# Patient Record
Sex: Female | Born: 1940 | Race: White | Hispanic: No | Marital: Married | Smoking: Former smoker
Health system: Southern US, Community
[De-identification: ages and names within clinical notes are randomized; demographics above are authoritative.]

## PROBLEM LIST (undated history)

## (undated) DIAGNOSIS — C801 Malignant (primary) neoplasm, unspecified: Secondary | ICD-10-CM

## (undated) DIAGNOSIS — Z9221 Personal history of antineoplastic chemotherapy: Secondary | ICD-10-CM

## (undated) DIAGNOSIS — Z923 Personal history of irradiation: Secondary | ICD-10-CM

## (undated) DIAGNOSIS — E079 Disorder of thyroid, unspecified: Secondary | ICD-10-CM

## (undated) DIAGNOSIS — C4490 Unspecified malignant neoplasm of skin, unspecified: Secondary | ICD-10-CM

## (undated) HISTORY — DX: Disorder of thyroid, unspecified: E07.9

## (undated) HISTORY — DX: Malignant (primary) neoplasm, unspecified: C80.1

## (undated) HISTORY — PX: TONSILECTOMY, ADENOIDECTOMY, BILATERAL MYRINGOTOMY AND TUBES: SHX2538

## (undated) HISTORY — PX: TUBAL LIGATION: SHX77

## (undated) HISTORY — PX: INNER EAR SURGERY: SHX679

---

## 2014-07-03 DIAGNOSIS — C801 Malignant (primary) neoplasm, unspecified: Secondary | ICD-10-CM

## 2014-07-03 HISTORY — DX: Malignant (primary) neoplasm, unspecified: C80.1

## 2016-07-16 ENCOUNTER — Encounter: Payer: Self-pay | Admitting: Family Medicine

## 2016-07-16 ENCOUNTER — Ambulatory Visit (INDEPENDENT_AMBULATORY_CARE_PROVIDER_SITE_OTHER): Payer: Medicare Other | Admitting: Family Medicine

## 2016-07-16 DIAGNOSIS — E559 Vitamin D deficiency, unspecified: Secondary | ICD-10-CM

## 2016-07-16 DIAGNOSIS — C3491 Malignant neoplasm of unspecified part of right bronchus or lung: Secondary | ICD-10-CM

## 2016-07-16 DIAGNOSIS — Z85118 Personal history of other malignant neoplasm of bronchus and lung: Secondary | ICD-10-CM | POA: Insufficient documentation

## 2016-07-16 DIAGNOSIS — R2681 Unsteadiness on feet: Secondary | ICD-10-CM

## 2016-07-16 DIAGNOSIS — E039 Hypothyroidism, unspecified: Secondary | ICD-10-CM | POA: Insufficient documentation

## 2016-07-16 DIAGNOSIS — M150 Primary generalized (osteo)arthritis: Secondary | ICD-10-CM | POA: Diagnosis not present

## 2016-07-16 DIAGNOSIS — M1990 Unspecified osteoarthritis, unspecified site: Secondary | ICD-10-CM | POA: Insufficient documentation

## 2016-07-16 DIAGNOSIS — M159 Polyosteoarthritis, unspecified: Secondary | ICD-10-CM

## 2016-07-16 MED ORDER — ACETAMINOPHEN 500 MG PO TABS: 500.0000 mg | ORAL_TABLET | Freq: Four times a day (QID) | ORAL | 0 refills | Status: DC | PRN

## 2016-07-16 NOTE — Patient Instructions (Signed)
Stop Nexium (esomeprazole) and iron. Use Tylenol as needed instead of ibuprofen (safer on stomach and kidneys)

## 2016-07-16 NOTE — Progress Notes (Signed)
Date:  07/16/2016   Name:  Kimberly Burch   DOB:  Jun 25, 1940   MRN:  [id number]  PCP:  Adline Potter, MD    Chief Complaint: Establish Care   History of Present Illness:  This is a 76 y.o. female seen for initial visit. Dx'd SCC R lung 2 yrs ago, received chemo and RT, developed pneumonia, still has port L chest, scheduled for flush two weeks at [facility]. Chest CT April stable. Hypothyroidism on Synthroid, vit D def on supplement. Anemia in past but resolved, still on iron. On Nexium x yrs, no current GERD sxs. Biotin for hair/nails, b complex for energy, ibuprofen qwk for OA. No hx established CVD. Father died 23 DM/heart dz, mother died 103 heart dz, sisters with OA, thyroid dz. Mammo in May ok, colonoscopy 7 yrs ago ok. Tetanus unknown, pneumo x 1 2 yrs ago, unsure which one, zoster 10 yrs ago. No recent falls.  Review of Systems:  Review of Systems  Constitutional: Negative for chills, fatigue and fever.  HENT: Negative for ear pain, sinus pain and trouble swallowing.   Eyes: Negative for pain.  Respiratory: Negative for cough and shortness of breath.   Cardiovascular: Negative for chest pain, palpitations and leg swelling.  Gastrointestinal: Negative for abdominal pain, constipation and diarrhea.  Genitourinary: Negative for difficulty urinating.  Musculoskeletal: Negative for joint swelling.  Neurological: Negative for syncope and light-headedness.    Patient Active Problem List   Diagnosis Date Noted  . Squamous cell lung cancer ([place]) 07/16/2016  . Hypothyroidism 07/16/2016  . Gait instability 07/16/2016  . Vitamin D deficiency 07/16/2016    Prior to Admission medications   Medication Sig Start Date End Date Taking? Authorizing Provider  b complex vitamins capsule Take 1 capsule by mouth daily.   Yes [provider]  Biotin 1 MG CAPS Take by mouth.   Yes [provider]  cholecalciferol (VITAMIN D) 1000 units tablet Take 2,000 Units by mouth  daily.   Yes [provider]  levothyroxine (SYNTHROID, LEVOTHROID) 50 MCG tablet Take 50 mcg by mouth daily before breakfast.   Yes [provider]  Multiple Vitamins-Minerals (MULTIVITAMIN WITH MINERALS) tablet Take 1 tablet by mouth daily.   Yes [provider]    No Known Allergies  Past Surgical History:  Procedure Laterality Date  . INNER EAR SURGERY     for hearing loss. L ear.  . TONSILECTOMY, ADENOIDECTOMY, BILATERAL MYRINGOTOMY AND TUBES    . TUBAL LIGATION      Social History  Substance Use Topics  . Smoking status: Former Research scientist (life sciences)  . Smokeless tobacco: Never Used  . Alcohol use No    Family History  Problem Relation Age of Onset  . Diabetes Father   . Hyperlipidemia Father   . Cancer Maternal Aunt   . Cancer Paternal Grandmother     Medication list has been reviewed and updated.  Physical Examination: BP (!) 102/58   Pulse 94   Ht 5\' 4"  (1.626 m)   Wt 110 lb 9.6 oz (50.2 kg)   SpO2 98%   BMI 18.98 kg/m   Physical Exam  Constitutional: She is oriented to person, place, and time. She appears well-developed and well-nourished.  HENT:  Head: Normocephalic and atraumatic.  Right Ear: External ear normal.  Left Ear: External ear normal.  Nose: Nose normal.  Mouth/Throat: Oropharynx is clear and moist.  TMs clear  Eyes: Conjunctivae and EOM are normal. Pupils are equal, round, and reactive  to light.  Neck: Neck supple. No thyromegaly present.  Cardiovascular: Normal rate, regular rhythm and normal heart sounds.   Pulmonary/Chest: Effort normal and breath sounds normal.  Abdominal: Soft. She exhibits no distension and no mass. There is no tenderness.  Musculoskeletal: She exhibits no edema or deformity.  Lymphadenopathy:    She has no cervical adenopathy.  Neurological: She is alert and oriented to person, place, and time. Coordination normal.  Romberg negative, gait sl unsteady  Skin: Skin is warm and dry.  Psychiatric: She  has a normal mood and affect. Her behavior is normal.  Nursing note and vitals reviewed.   Assessment and Plan:  1. Squamous cell carcinoma of right lung (HCC) Followed by oncology, for port flush in two weeks - Comprehensive Metabolic Panel (CMET) - CBC  2. Hypothyroidism, unspecified type On Synthroid - TSH  3. Gait instability Consider PT referral - B12  4. Vitamin D deficiency On supplement - Vitamin D (25 hydroxy)  5. Primary osteoarthritis involving multiple joints Change ibuprofen to Tylenol prn  6. Med review D/c Nexium and iron as no clear indication for use  7. HM Consider Tdap, Pneumovax/Prevnar, Shingrix next visit  Return in about 4 weeks (around 08/13/2016).   45 mins spent with pt over half in counseling  Satira Anis. [facility]  07/16/2016

## 2016-07-17 LAB — COMPREHENSIVE METABOLIC PANEL
ALK PHOS: 117 IU/L (ref 39–117)
ALT: 20 IU/L (ref 0–32)
AST: 29 IU/L (ref 0–40)
Albumin/Globulin Ratio: 1.7 (ref 1.2–2.2)
Albumin: 4.6 g/dL (ref 3.5–4.8)
BUN/Creatinine Ratio: 17 (ref 12–28)
BUN: 13 mg/dL (ref 8–27)
Bilirubin Total: 0.5 mg/dL (ref 0.0–1.2)
CHLORIDE: 98 mmol/L (ref 96–106)
CO2: 28 mmol/L (ref 20–29)
Calcium: 9.8 mg/dL (ref 8.7–10.3)
Creatinine, Ser: 0.75 mg/dL (ref 0.57–1.00)
GFR calc Af Amer: 90 mL/min/{1.73_m2} (ref >59)
GFR calc non Af Amer: 78 mL/min/{1.73_m2} (ref >59)
GLOBULIN, TOTAL: 2.7 g/dL (ref 1.5–4.5)
Glucose: 77 mg/dL (ref 65–99)
POTASSIUM: 5.1 mmol/L (ref 3.5–5.2)
SODIUM: 142 mmol/L (ref 134–144)
Total Protein: 7.3 g/dL (ref 6.0–8.5)

## 2016-07-17 LAB — CBC
Hematocrit: 42.7 % (ref 34.0–46.6)
Hemoglobin: 14.1 g/dL (ref 11.1–15.9)
MCH: 28.8 pg (ref 26.6–33.0)
MCHC: 33 g/dL (ref 31.5–35.7)
MCV: 87 fL (ref 79–97)
PLATELETS: 223 10*3/uL (ref 150–379)
RBC: 4.9 x10E6/uL (ref 3.77–5.28)
RDW: 15.1 % (ref 12.3–15.4)
WBC: 5.9 10*3/uL (ref 3.4–10.8)

## 2016-07-17 LAB — VITAMIN D 25 HYDROXY (VIT D DEFICIENCY, FRACTURES): Vit D, 25-Hydroxy: 54.4 ng/mL (ref 30.0–100.0)

## 2016-07-17 LAB — VITAMIN B12: VITAMIN B 12: 635 pg/mL (ref 232–1245)

## 2016-07-17 LAB — TSH: TSH: 4 u[IU]/mL (ref 0.450–4.500)

## 2016-07-29 NOTE — Progress Notes (Signed)
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 She is moving to the area and is transferring care to continue to monitor for follow-up. She currently feels well and is asymptomatic. She has no neurologic complaints. She denies any recent fevers or illnesses. She has a good appetite and denies weight loss. She has no chest pain, shortness of breath, cough, or hemoptysis. She denies any nausea, vomiting, constipation, or diarrhea. She has no urinary complaints. Patient feels at her baseline and offers no specific complaints today.  REVIEW OF SYSTEMS:   Review of Systems  Constitutional: Negative.  Negative for fever, malaise/fatigue and weight loss.  Respiratory: Negative.  Negative for cough and shortness of breath.   Cardiovascular: Negative.  Negative for chest pain and leg swelling.  Gastrointestinal: Negative.  Negative for abdominal pain.  Genitourinary: Negative.   Musculoskeletal: Negative.   Skin: Negative.  Negative for rash.  Neurological: Negative.  Negative for sensory change and weakness.  Psychiatric/Behavioral: Negative.  The patient is not nervous/anxious.     As per HPI. Otherwise, a complete review of systems is negative.  PAST MEDICAL HISTORY: Past Medical History:  Diagnosis Date  . Cancer ([place]) 07/2014   lung cancer- couldn't do surgery. Radiation and chemo.   . Thyroid disease     PAST SURGICAL HISTORY: Past Surgical History:  Procedure Laterality Date  . INNER EAR SURGERY     for hearing loss. L ear.  . TONSILECTOMY, ADENOIDECTOMY, BILATERAL MYRINGOTOMY AND TUBES    . TUBAL  LIGATION      FAMILY HISTORY: Family History  Problem Relation Age of Onset  . Diabetes Father   . Hyperlipidemia Father   . Cancer Maternal Aunt   . Cancer Paternal Grandmother     ADVANCED DIRECTIVES (Y/N):  N  HEALTH MAINTENANCE: Social History  Substance Use Topics  . Smoking status: Former Research scientist (life sciences)  . Smokeless tobacco: Never Used  . Alcohol use No     Colonoscopy:  PAP:  Bone density:  Lipid panel:  No Known Allergies  Current Outpatient Prescriptions  Medication Sig Dispense Refill  . acetaminophen (TYLENOL) 500 MG tablet Take 1 tablet (500 mg total) by mouth every 6 (six) hours as needed. 30 tablet 0  . b complex vitamins capsule Take 1 capsule by mouth daily.    . Biotin 1 MG CAPS Take by mouth.    . cholecalciferol (VITAMIN D) 1000 units tablet Take 2,000 Units by mouth daily.    Marland Kitchen levothyroxine (SYNTHROID, LEVOTHROID) 50 MCG tablet Take 50 mcg by mouth daily before breakfast.    . Multiple Vitamins-Minerals (MULTIVITAMIN WITH MINERALS) tablet Take 1 tablet by mouth daily.     No current facility-administered medications for this visit.    Facility-Administered Medications Ordered in Other Visits  Medication Dose Route Frequency Provider Last Rate Last Dose  . sodium chloride flush (NS) 0.9 % injection 10 mL  10 mL Intravenous PRN Lloyd Huger, MD   10 mL at 07/30/16 1155    OBJECTIVE: Vitals:   07/30/16 1111  BP: 106/70  Pulse: 69  Resp: 20  Temp: (!) 96.1 F (35.6 C)  Body mass index is 19.11 kg/m.    ECOG FS:0 - Asymptomatic  General: Well-developed, well-nourished, no acute distress. Eyes: Pink conjunctiva, anicteric sclera. HEENT: Normocephalic, moist mucous membranes, clear oropharnyx. Lungs: Clear to auscultation bilaterally. Heart: Regular rate and rhythm. No rubs, murmurs, or gallops. Abdomen: Soft, nontender, nondistended. No organomegaly noted, normoactive bowel sounds. Musculoskeletal: No edema, cyanosis, or clubbing. Neuro:  Alert, answering all questions appropriately. Cranial nerves grossly intact. Skin: No rashes or petechiae noted. Psych: Normal affect. Lymphatics: No cervical, calvicular, axillary or inguinal LAD.   LAB RESULTS:  Lab Results  Component Value Date   NA 142 07/16/2016   K 5.1 07/16/2016   CL 98 07/16/2016   CO2 28 07/16/2016   GLUCOSE 77 07/16/2016   BUN 13 07/16/2016   CREATININE 0.75 07/16/2016   CALCIUM 9.8 07/16/2016   PROT 7.3 07/16/2016   ALBUMIN 4.6 07/16/2016   AST 29 07/16/2016   ALT 20 07/16/2016   ALKPHOS 117 07/16/2016   BILITOT 0.5 07/16/2016   GFRNONAA 78 07/16/2016   GFRAA 90 07/16/2016    Lab Results  Component Value Date   WBC 5.9 07/16/2016   HGB 14.1 07/16/2016   HCT 42.7 07/16/2016   MCV 87 07/16/2016   PLT 223 07/16/2016     STUDIES: No results found.  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 She then received 2 cycles of consolidation treatment completing on November 15, 2014. She had a bronchoscopy performed on March 02, 2016 which did not reveal any signs of recurrent or progressive disease. 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 These findings were similar to prior CTs. No evidence of recurrent or progressive disease. No intervention is needed at this time. Patient will return to clinic in mid October for repeat CT scan and further evaluation. Plan on doing CT scans every 6 months until patient is 3 years removed from completing treatment in October 2019. At that point, patient can be switched to yearly imaging. 2. History of hemoptysis: Bronchoscopy and CT scan as above. We will consider a referral to pulmonology if necessary in the future. 3. Port:  Patient will require port flushes every 6 weeks.  Approximately 60 minutes was spent in discussion of which greater than 50% was consultation.  Patient expressed understanding and was in agreement with this plan. She also understands that She can call clinic at any time with any questions, concerns, or complaints.   Cancer Staging Squamous cell lung cancer [facility]) Staging form: Lung, AJCC 8th Edition - Clinical stage from 07/30/2016: Stage IIIB (cT4, cN2, cM0) - Signed by Lloyd Huger, MD on 07/30/2016   Lloyd Huger, MD   07/30/2016 1:06 PM

## 2016-07-30 ENCOUNTER — Inpatient Hospital Stay: Payer: Medicare Other | Attending: Oncology | Admitting: Oncology

## 2016-07-30 ENCOUNTER — Inpatient Hospital Stay: Payer: Medicare Other

## 2016-07-30 ENCOUNTER — Other Ambulatory Visit: Payer: Self-pay

## 2016-07-30 VITALS — BP 106/70 | HR 69 | Temp 96.1°F | Resp 20 | Wt 111.3 lb

## 2016-07-30 DIAGNOSIS — Z95828 Presence of other vascular implants and grafts: Secondary | ICD-10-CM

## 2016-07-30 DIAGNOSIS — Z809 Family history of malignant neoplasm, unspecified: Secondary | ICD-10-CM | POA: Insufficient documentation

## 2016-07-30 DIAGNOSIS — Z452 Encounter for adjustment and management of vascular access device: Secondary | ICD-10-CM | POA: Diagnosis not present

## 2016-07-30 DIAGNOSIS — Z85118 Personal history of other malignant neoplasm of bronchus and lung: Secondary | ICD-10-CM | POA: Insufficient documentation

## 2016-07-30 DIAGNOSIS — Z923 Personal history of irradiation: Secondary | ICD-10-CM | POA: Diagnosis not present

## 2016-07-30 DIAGNOSIS — Z79899 Other long term (current) drug therapy: Secondary | ICD-10-CM | POA: Diagnosis not present

## 2016-07-30 DIAGNOSIS — Z9221 Personal history of antineoplastic chemotherapy: Secondary | ICD-10-CM | POA: Diagnosis not present

## 2016-07-30 DIAGNOSIS — Z87891 Personal history of nicotine dependence: Secondary | ICD-10-CM

## 2016-07-30 DIAGNOSIS — E079 Disorder of thyroid, unspecified: Secondary | ICD-10-CM | POA: Diagnosis not present

## 2016-07-30 DIAGNOSIS — C3491 Malignant neoplasm of unspecified part of right bronchus or lung: Secondary | ICD-10-CM

## 2016-07-30 DIAGNOSIS — R918 Other nonspecific abnormal finding of lung field: Secondary | ICD-10-CM | POA: Insufficient documentation

## 2016-07-30 MED ORDER — SODIUM CHLORIDE 0.9% FLUSH
10.0000 mL | INTRAVENOUS | Status: DC | PRN
  Administered 2016-07-30: 10 mL via INTRAVENOUS
  Filled 2016-07-30: qty 10

## 2016-07-30 MED ORDER — HEPARIN SOD (PORK) LOCK FLUSH 100 UNIT/ML IV SOLN
500.0000 [IU] | Freq: Once | INTRAVENOUS | Status: AC
  Administered 2016-07-30: 500 [IU] via INTRAVENOUS

## 2016-07-30 NOTE — Progress Notes (Signed)
Patient here today to establish care regarding lung cancer.

## 2016-08-12 ENCOUNTER — Encounter: Payer: Self-pay | Admitting: Family Medicine

## 2016-08-16 ENCOUNTER — Encounter: Payer: Self-pay | Admitting: Family Medicine

## 2016-08-16 ENCOUNTER — Ambulatory Visit (INDEPENDENT_AMBULATORY_CARE_PROVIDER_SITE_OTHER): Payer: Medicare Other | Admitting: Family Medicine

## 2016-08-16 VITALS — BP 110/78 | HR 97 | Resp 16 | Ht 64.0 in | Wt 111.2 lb

## 2016-08-16 DIAGNOSIS — M159 Polyosteoarthritis, unspecified: Secondary | ICD-10-CM

## 2016-08-16 DIAGNOSIS — M150 Primary generalized (osteo)arthritis: Secondary | ICD-10-CM | POA: Diagnosis not present

## 2016-08-16 DIAGNOSIS — R2681 Unsteadiness on feet: Secondary | ICD-10-CM

## 2016-08-16 DIAGNOSIS — C3491 Malignant neoplasm of unspecified part of right bronchus or lung: Secondary | ICD-10-CM | POA: Diagnosis not present

## 2016-08-16 DIAGNOSIS — Z23 Encounter for immunization: Secondary | ICD-10-CM

## 2016-08-16 DIAGNOSIS — E559 Vitamin D deficiency, unspecified: Secondary | ICD-10-CM | POA: Diagnosis not present

## 2016-08-16 DIAGNOSIS — E039 Hypothyroidism, unspecified: Secondary | ICD-10-CM

## 2016-08-16 DIAGNOSIS — G4762 Sleep related leg cramps: Secondary | ICD-10-CM | POA: Insufficient documentation

## 2016-08-16 MED ORDER — ZOSTER VAC RECOMB ADJUVANTED 50 MCG/0.5ML IM SUSR: 0.5000 mL | Freq: Once | INTRAMUSCULAR | 1 refills | Status: AC

## 2016-08-16 NOTE — Patient Instructions (Signed)
Leg Cramps Leg cramps occur when a muscle or muscles tighten and you have no control over this tightening (involuntary muscle contraction). Muscle cramps can develop in any muscle, but the most common place is in the calf muscles of the leg. Those cramps can occur during exercise or when you are at rest. Leg cramps are painful, and they may last for a few seconds to a few minutes. Cramps may return several times before they finally stop. Usually, leg cramps are not caused by a serious medical problem. In many cases, the cause is not known. Some common causes include:  Overexertion.  Overuse from repetitive motions, or doing the same thing over and over.  Remaining in a certain position for a long period of time.  Improper preparation, form, or technique while performing a sport or an activity.  Dehydration.  Injury.  Side effects of some medicines.  Abnormally low levels of the salts and ions in your blood (electrolytes), especially potassium and calcium. These levels could be low if you are taking water pills (diuretics) or if you are pregnant.  Follow these instructions at home: Watch your condition for any changes. Taking the following actions may help to lessen any discomfort that you are feeling:  Stay well-hydrated. Drink enough fluid to keep your urine clear or pale yellow.  Try massaging, stretching, and relaxing the affected muscle. Do this for several minutes at a time.  For tight or tense muscles, use a warm towel, heating pad, or hot shower water directed to the affected area.  If you are sore or have pain after a cramp, applying ice to the affected area may relieve discomfort. ? Put ice in a plastic bag. ? Place a towel between your skin and the bag. ? Leave the ice on for 20 minutes, 2-3 times per day.  Avoid strenuous exercise for several days if you have been having frequent leg cramps.  Make sure that your diet includes the essential minerals for your muscles to  work normally.  Take medicines only as directed by your health care provider.  Contact a health care provider if:  Your leg cramps get more severe or more frequent, or they do not improve over time.  Your foot becomes cold, numb, or blue. This information is not intended to replace advice given to you by your health care provider. Make sure you discuss any questions you have with your health care provider. Document Released: 02/26/2004 Document Revised: 06/26/2015 Document Reviewed: 12/26/2013 Elsevier Interactive Patient Education  2018 Elsevier Inc.  

## 2016-08-17 ENCOUNTER — Ambulatory Visit: Payer: Medicare Other | Admitting: Family Medicine

## 2016-08-17 NOTE — Progress Notes (Signed)
Date:  08/16/2016   Name:  Kimberly Burch   DOB:  Mar 20, 1940   MRN:  [id number]  PCP:  Adline Potter, MD    Chief Complaint: Gastroesophageal Reflux (stopped nexium last OV) and leg cramp   History of Present Illness:  This is a 76 y.o. female seen in one month f/u from initial visit. Able to stop Nexium without sx recurrence. Blood work all ok. C/o R foot cramping at night, vinegar sometimes helps. OA sxs well controlled on Tylenol prn. Had Tdap 2011 and pneumo imms x 2, needs zoster imm.   Review of Systems:  Review of Systems  Constitutional: Negative for chills and fever.  Respiratory: Negative for cough and shortness of breath.   Cardiovascular: Negative for chest pain and leg swelling.  Neurological: Negative for syncope and light-headedness.    Patient Active Problem List   Diagnosis Date Noted  . Nocturnal leg cramps 08/16/2016  . Squamous cell lung cancer ([place]) 07/16/2016  . Hypothyroidism 07/16/2016  . Gait instability 07/16/2016  . Vitamin D deficiency 07/16/2016  . Osteoarthritis 07/16/2016    Prior to Admission medications   Medication Sig Start Date End Date Taking? Authorizing Provider  acetaminophen (TYLENOL) 500 MG tablet Take 1 tablet (500 mg total) by mouth every 6 (six) hours as needed. 07/16/16  Yes Eleri Ruben, Gwyndolyn Saxon, MD  b complex vitamins capsule Take 1 capsule by mouth daily.   Yes [provider]  Biotin 1 MG CAPS Take by mouth.   Yes [provider]  cholecalciferol (VITAMIN D) 1000 units tablet Take 2,000 Units by mouth daily.   Yes [provider]  levothyroxine (SYNTHROID, LEVOTHROID) 50 MCG tablet Take 50 mcg by mouth daily before breakfast.   Yes [provider]  Multiple Vitamins-Minerals (MULTIVITAMIN WITH MINERALS) tablet Take 1 tablet by mouth daily.   Yes [provider]    No Known Allergies  Past Surgical History:  Procedure Laterality Date  . INNER EAR SURGERY     for hearing loss. L ear.  .  TONSILECTOMY, ADENOIDECTOMY, BILATERAL MYRINGOTOMY AND TUBES    . TUBAL LIGATION      Social History  Substance Use Topics  . Smoking status: Former Research scientist (life sciences)  . Smokeless tobacco: Never Used  . Alcohol use No    Family History  Problem Relation Age of Onset  . Diabetes Father   . Hyperlipidemia Father   . Cancer Maternal Aunt   . Cancer Paternal Grandmother     Medication list has been reviewed and updated.  Physical Examination: BP 110/78   Pulse 97   Resp 16   Ht 5\' 4"  (1.626 m)   Wt 111 lb 3.2 oz (50.4 kg)   SpO2 98%   BMI 19.09 kg/m   Physical Exam  Constitutional: She appears well-developed and well-nourished.  Cardiovascular: Normal rate, regular rhythm, normal heart sounds and intact distal pulses.   Pulmonary/Chest: Effort normal and breath sounds normal.  Musculoskeletal: She exhibits no edema.  Neurological: She is alert.  Skin: Skin is warm and dry.  Psychiatric: She has a normal mood and affect. Her behavior is normal.  Nursing note and vitals reviewed.   Assessment and Plan:  1. Squamous cell carcinoma of right lung ([place]) Followed by oncology  2. Hypothyroidism, unspecified type Well controlled on Synthroid  3. Primary osteoarthritis involving multiple joints Well controlled on prn Tylenol  4. Vitamin D deficiency Well controlled on supplement  5. Gait instability Consider PT referral  6. Nocturnal leg  cramps Trial vinegar/pickle/mustard, handout given  7. Need for zoster vaccination - Zoster Vac Recomb Adjuvanted [facility]) injection; Inject 0.5 mLs into the muscle once.  Dispense: 0.5 mL; Refill: 1  Return in about 2 months (around 10/17/2016).  Satira Anis. [facility]  08/17/2016

## 2016-09-09 ENCOUNTER — Inpatient Hospital Stay: Payer: Medicare Other | Attending: Oncology

## 2016-09-09 DIAGNOSIS — Z452 Encounter for adjustment and management of vascular access device: Secondary | ICD-10-CM | POA: Insufficient documentation

## 2016-09-09 DIAGNOSIS — Z95828 Presence of other vascular implants and grafts: Secondary | ICD-10-CM

## 2016-09-09 DIAGNOSIS — Z923 Personal history of irradiation: Secondary | ICD-10-CM | POA: Diagnosis not present

## 2016-09-09 DIAGNOSIS — Z9221 Personal history of antineoplastic chemotherapy: Secondary | ICD-10-CM | POA: Diagnosis not present

## 2016-09-09 DIAGNOSIS — Z85118 Personal history of other malignant neoplasm of bronchus and lung: Secondary | ICD-10-CM | POA: Diagnosis present

## 2016-09-09 MED ORDER — HEPARIN SOD (PORK) LOCK FLUSH 100 UNIT/ML IV SOLN
INTRAVENOUS | Status: AC
  Filled 2016-09-09: qty 5

## 2016-09-09 MED ORDER — HEPARIN SOD (PORK) LOCK FLUSH 100 UNIT/ML IV SOLN
500.0000 [IU] | Freq: Once | INTRAVENOUS | Status: AC
  Administered 2016-09-09: 500 [IU] via INTRAVENOUS

## 2016-09-09 MED ORDER — SODIUM CHLORIDE 0.9% FLUSH
10.0000 mL | INTRAVENOUS | Status: DC | PRN
  Administered 2016-09-09: 10 mL via INTRAVENOUS
  Filled 2016-09-09: qty 10

## 2016-09-10 ENCOUNTER — Inpatient Hospital Stay: Payer: Medicare Other

## 2016-09-13 ENCOUNTER — Encounter: Payer: Self-pay | Admitting: Internal Medicine

## 2016-09-14 ENCOUNTER — Ambulatory Visit (INDEPENDENT_AMBULATORY_CARE_PROVIDER_SITE_OTHER): Payer: Medicare Other | Admitting: Family Medicine

## 2016-09-14 ENCOUNTER — Encounter: Payer: Self-pay | Admitting: Family Medicine

## 2016-09-14 VITALS — BP 138/76 | HR 92 | Ht 64.0 in | Wt 115.0 lb

## 2016-09-14 DIAGNOSIS — E559 Vitamin D deficiency, unspecified: Secondary | ICD-10-CM

## 2016-09-14 DIAGNOSIS — C3491 Malignant neoplasm of unspecified part of right bronchus or lung: Secondary | ICD-10-CM

## 2016-09-14 DIAGNOSIS — R2681 Unsteadiness on feet: Secondary | ICD-10-CM | POA: Diagnosis not present

## 2016-09-14 DIAGNOSIS — M150 Primary generalized (osteo)arthritis: Secondary | ICD-10-CM

## 2016-09-14 DIAGNOSIS — E039 Hypothyroidism, unspecified: Secondary | ICD-10-CM | POA: Diagnosis not present

## 2016-09-14 DIAGNOSIS — M159 Polyosteoarthritis, unspecified: Secondary | ICD-10-CM

## 2016-09-14 DIAGNOSIS — S46811A Strain of other muscles, fascia and tendons at shoulder and upper arm level, right arm, initial encounter: Secondary | ICD-10-CM | POA: Diagnosis not present

## 2016-09-14 MED ORDER — NAPROXEN 500 MG PO TABS: 500.0000 mg | ORAL_TABLET | Freq: Two times a day (BID) | ORAL | 0 refills | Status: DC

## 2016-09-15 ENCOUNTER — Ambulatory Visit: Payer: Medicare Other | Admitting: Family Medicine

## 2016-09-15 NOTE — Progress Notes (Signed)
Date:  09/14/2016   Name:  Kimberly Burch   DOB:  May 02, 1940   MRN:  [id number]  PCP:  Adline Potter, MD    Chief Complaint: Shoulder Pain (5-6 years ago had trouble with Right Shoulder. Was given steriod shot back then. Helped but now pain is back. She does arm exercises to try and help pain. Pain is radiating down into back and right side of neck. Holding arm up over head hurts. Tried taking 2 Ibuprofen tabs OTC but not getting any better. Patient wanting to get XRay.  )   History of Present Illness:  This is a 76 y.o. female seen for one month f/u. Stage IIIB SCCL seeing oncology. C/o 5-6 yr hx R shoulder pain, told overuse in past, improved with steroid inj, worse now x 1 wk, started after bowling, radiates to interscapular area, getting worse. OA pain ok on prn Tylenol, taking vit D supp, gait instability noted on exam last visit.  Review of Systems:  Review of Systems  Constitutional: Negative for chills and fever.  Respiratory: Negative for cough and shortness of breath.   Cardiovascular: Negative for chest pain and leg swelling.  Gastrointestinal: Negative for abdominal pain.  Genitourinary: Negative for difficulty urinating.  Neurological: Negative for syncope and light-headedness.    Patient Active Problem List   Diagnosis Date Noted  . Nocturnal leg cramps 08/16/2016  . Squamous cell lung cancer ([place]) 07/16/2016  . Hypothyroidism 07/16/2016  . Gait instability 07/16/2016  . Vitamin D deficiency 07/16/2016  . Osteoarthritis 07/16/2016    Prior to Admission medications   Medication Sig Start Date End Date Taking? Authorizing Provider  acetaminophen (TYLENOL) 500 MG tablet Take 1 tablet (500 mg total) by mouth every 6 (six) hours as needed. 07/16/16  Yes Trenell Moxey, Gwyndolyn Saxon, MD  levothyroxine (SYNTHROID, LEVOTHROID) 50 MCG tablet Take 50 mcg by mouth daily before breakfast.   Yes [provider]  Multiple Vitamins-Minerals (MULTIVITAMIN WITH MINERALS) tablet Take 1  tablet by mouth daily.   Yes [provider]  naproxen (NAPROSYN) 500 MG tablet Take 1 tablet (500 mg total) by mouth 2 (two) times daily with a meal. 09/14/16   Adline Potter, MD    No Known Allergies  Past Surgical History:  Procedure Laterality Date  . INNER EAR SURGERY     for hearing loss. L ear.  . TONSILECTOMY, ADENOIDECTOMY, BILATERAL MYRINGOTOMY AND TUBES    . TUBAL LIGATION      Social History  Substance Use Topics  . Smoking status: Former Research scientist (life sciences)  . Smokeless tobacco: Never Used  . Alcohol use No    Family History  Problem Relation Age of Onset  . Diabetes Father   . Hyperlipidemia Father   . Cancer Maternal Aunt   . Cancer Paternal Grandmother     Medication list has been reviewed and updated.  Physical Examination: BP 138/76   Pulse 92   Ht 5\' 4"  (1.626 m)   Wt 115 lb (52.2 kg)   SpO2 99%   BMI 19.74 kg/m   Physical Exam  Constitutional: She appears well-developed and well-nourished.  Cardiovascular: Normal rate, regular rhythm and normal heart sounds.   Pulmonary/Chest: Effort normal and breath sounds normal.  Musculoskeletal: She exhibits no edema.  Tender over R trapezius  Neurological: She is alert.  Skin: Skin is warm and dry.  Psychiatric: She has a normal mood and affect. Her behavior is normal.  Nursing note and vitals reviewed.   Assessment and Plan:  1.  Trapezius strain, right, initial encounter Naprosyn x 2 wks only, doubt related to [facility] - Ambulatory referral to Physical Therapy  2. Squamous cell carcinoma of right lung ([place]) Followed by oncology  3. Gait instability - Ambulatory referral to Physical Therapy  4. Hypothyroidism, unspecified type Well controlled on Synthroid  5. Primary osteoarthritis involving multiple joints Well controlled on prn Tylenol  6. Vitamin D deficiency Well controlled on supplement  Return if symptoms worsen or fail to improve.  Satira Anis. [facility]  09/15/2016

## 2016-09-20 ENCOUNTER — Ambulatory Visit: Payer: Medicare Other | Attending: Family Medicine | Admitting: Physical Therapy

## 2016-09-20 ENCOUNTER — Encounter: Payer: Self-pay | Admitting: Physical Therapy

## 2016-09-20 DIAGNOSIS — R293 Abnormal posture: Secondary | ICD-10-CM | POA: Diagnosis present

## 2016-09-20 DIAGNOSIS — M542 Cervicalgia: Secondary | ICD-10-CM | POA: Insufficient documentation

## 2016-09-20 DIAGNOSIS — G8929 Other chronic pain: Secondary | ICD-10-CM | POA: Insufficient documentation

## 2016-09-20 DIAGNOSIS — M25511 Pain in right shoulder: Secondary | ICD-10-CM | POA: Insufficient documentation

## 2016-09-20 DIAGNOSIS — M436 Torticollis: Secondary | ICD-10-CM

## 2016-09-20 NOTE — Therapy (Signed)
[facility] [address] [place], [place], [address] Phone: [phone]   Fax:  [phone]  Physical Therapy Evaluation  Patient Details  Name: Kimberly Burch MRN: [id number] Date of Birth: 08-Jan-1941 Referring Provider: Dr. Vicente Masson  Encounter Date: 09/20/2016      PT End of Session - 09/20/16 1254    Visit Number 1   Number of Visits 8   Date for PT Re-Evaluation 10/18/16   Authorization - Visit Number 1   Authorization - Number of Visits 10   PT Start Time 6962   PT Stop Time 1345   PT Time Calculation (min) 58 min   Activity Tolerance Patient tolerated treatment well   Behavior During Therapy [facility] for tasks assessed/performed      Past Medical History:  Diagnosis Date  . Cancer ([place]) 07/2014   lung cancer- couldn't do surgery. Radiation and chemo.   . Thyroid disease     Past Surgical History:  Procedure Laterality Date  . INNER EAR SURGERY     for hearing loss. L ear.  . TONSILECTOMY, ADENOIDECTOMY, BILATERAL MYRINGOTOMY AND TUBES    . TUBAL LIGATION      There were no vitals filed for this visit.       Subjective Assessment - 09/20/16 1249    Subjective Pt. reports 5/10 R shoulder/ UT pain currently. Pt. states R UT pain has been on/off over past 5 years.   Pt. reports difficulty sleeping on R side.  Pt. has received an injection in past with benefit.     Pertinent History Pt. reports having R lung cancer 2 years ago and doing much better.     Limitations Lifting;House hold activities   Patient Stated Goals Increase R shoulder AROM/ pain-free mobility with daily tasks/ managing hair.     Currently in Pain? Yes   Pain Score 5    Pain Location Shoulder   Pain Orientation Right   Pain Descriptors / Indicators Discomfort;Aching   Pain Type Chronic pain      Shoulder ROM: WFL  Shoulder MMT: 4+/5 gross bilaterall  Cervical ROM: WFL with exception of lateral flexion: R 37 deg; L 24 deg  Palpation:  tenderness to palpation of R UT, levator scap, cervical paraspinals. Noted trigger point in R UT and levator scap  Manual: Notable hypomobility in C4-T5 vertebrae; pt reports tenderness to palpation    Objective measurements completed on examination: See above findings.   TherEx: Self UT stretch 3x30s Self levator scap stretch 3x30s Thoracic extensions over chair x15 Scapular retractions with RTB x15   Pt required continued verbal and tactile cueing to perform stretches and exercises correctly  Pt reports decrease in pain from 5/10 at beginning of session to 0/10 at the end of tx session        PT Education - 09/20/16 1400    Education provided Yes   Education Details see handout   Person(s) Educated Patient   Methods Explanation;Handout;Demonstration;Tactile cues;Verbal cues   Comprehension Verbalized understanding;Returned demonstration             PT Long Term Goals - 09/20/16 1713      PT LONG TERM GOAL #1   Title Pt will decrease QuickDASH score to <50% in order to decrease self-reported disability   Baseline 09/20/16: 68&   Time 4   Period Weeks   Status New     PT LONG TERM GOAL #2   Title Pt will report ability to work  in her garden without increase in pain   Baseline unable/afraid to work in garden as of 09-28-22   Time 4   Period Weeks   Status New     PT LONG TERM GOAL #3   Title Pt will increase cervical lateral flexion to >45 deg bilaterally    Baseline R: 37 deg; L: 24 deg on 09/27/16   Time 4   Period Weeks   Status New     PT LONG TERM GOAL #4   Title Pt will report decreased tenderness upon palpation of R UT, levator scapular, and cervical paraspinals   Baseline (+) for tenderness   Time 4   Period Weeks   Status New           Plan - 2016/09/27 1255    Clinical Impression Statement Pt age 76 reports to PT with chronic R upper back beginning 5-6 yrs ago with recent worsening of symptoms. Pt demonstrates full cervical ROM with exception  of decreased lateral flexion bilaterally (R 36deg, L 24deg). Pt also demonstrates B shoulder MMT WFL, but reports she has "lost all her muscle" after bout with lung cancer. Pt demonstrates marked self-reported shoulder disability made evident by QuickDASH score of 68% (severe disability). Pt demonstrates tenderness to palpation of UT and levator scapulae with palpable trigger point, as well as cervical paraspinals with no palpable trigger point.  Pt very active and motivated to return back to activities (bowling, golf, etc). Pt will benefit from skilled PT in order to increase pain-free ROM in order to allow pt to participate in desired activities.    Clinical Presentation Stable   Clinical Decision Making Moderate   Rehab Potential Good   PT Frequency 2x / week   PT Duration 4 weeks   PT Treatment/Interventions ADLs/Self Care Home Management;Cryotherapy;Moist Heat;Neuromuscular re-education;Therapeutic exercise;Therapeutic activities;Functional mobility training;Patient/family education;Manual techniques;Passive range of motion;Dry needling   PT Next Visit Plan Review HEP      Patient will benefit from skilled therapeutic intervention in order to improve the following deficits and impairments:  Hypomobility, Decreased activity tolerance, Decreased strength, Pain, Impaired UE functional use, Decreased mobility, Improper body mechanics, Decreased range of motion, Impaired flexibility, Postural dysfunction  Visit Diagnosis: Acute muscle stiffness of neck  Abnormal posture  Chronic right shoulder pain  Neck pain on right side      G-Codes - 2016-09-27 1633    Functional Assessment Tool Used (Outpatient Only) Clinical impression/ pain/ joint stiffness/ muscle weakness/ QuickDASH   Functional Limitation Carrying, moving and handling objects   Carrying, Moving and Handling Objects Current Status (G9211) At least 20 percent but less than 40 percent impaired, limited or restricted   Carrying,  Moving and Handling Objects Goal Status (H4174) At least 1 percent but less than 20 percent impaired, limited or restricted       Problem List Patient Active Problem List   Diagnosis Date Noted  . Nocturnal leg cramps 08/16/2016  . Squamous cell lung cancer ([place]) 07/16/2016  . Hypothyroidism 07/16/2016  . Gait instability 07/16/2016  . Vitamin D deficiency 07/16/2016  . Osteoarthritis 07/16/2016    Pura Spice, PT, DPT # [id number] Betsy Coder, SPT 09/21/2016, 1:25 PM  [place] [facility] [address] [place], [place], [address] Phone: [phone]   Fax:  [phone]  Name: Kimberly Burch MRN: [id number] Date of Birth: [birth year]

## 2016-09-27 ENCOUNTER — Ambulatory Visit: Payer: Medicare Other | Admitting: Physical Therapy

## 2016-09-27 ENCOUNTER — Encounter: Payer: Self-pay | Admitting: Physical Therapy

## 2016-09-27 DIAGNOSIS — R293 Abnormal posture: Secondary | ICD-10-CM

## 2016-09-27 DIAGNOSIS — M542 Cervicalgia: Secondary | ICD-10-CM

## 2016-09-27 DIAGNOSIS — M436 Torticollis: Secondary | ICD-10-CM

## 2016-09-27 DIAGNOSIS — M25511 Pain in right shoulder: Secondary | ICD-10-CM

## 2016-09-27 DIAGNOSIS — G8929 Other chronic pain: Secondary | ICD-10-CM

## 2016-09-27 NOTE — Therapy (Signed)
[place] [facility] [address] [place], [place], [address] Phone: [phone]   Fax:  [phone]  Physical Therapy Treatment  Patient Details  Name: Kimberly Burch MRN: [id number] Date of Birth: Jun 10, 1940 Referring Provider: Dr. Vicente Masson  Encounter Date: 09/27/2016      PT End of Session - 09/27/16 1249    Visit Number 2   Number of Visits 8   Date for PT Re-Evaluation 10/18/16   Authorization - Visit Number 2   Authorization - Number of Visits 10   PT Start Time 1304   PT Stop Time 1356   PT Time Calculation (min) 52 min   Activity Tolerance Patient tolerated treatment well   Behavior During Therapy [facility] for tasks assessed/performed      Past Medical History:  Diagnosis Date  . Cancer ([place]) 07/2014   lung cancer- couldn't do surgery. Radiation and chemo.   . Thyroid disease     Past Surgical History:  Procedure Laterality Date  . INNER EAR SURGERY     for hearing loss. L ear.  . TONSILECTOMY, ADENOIDECTOMY, BILATERAL MYRINGOTOMY AND TUBES    . TUBAL LIGATION      There were no vitals filed for this visit.      Subjective Assessment - 09/27/16 1249    Subjective Pt. reports she is doing better.  No issues with HEP.  Pt. reports 2/10 R UT pain.    Pertinent History Pt. reports having R lung cancer 2 years ago and doing much better.     Limitations Lifting;House hold activities   Patient Stated Goals Increase R shoulder AROM/ pain-free mobility with daily tasks/ managing hair.     Currently in Pain? Yes   Pain Score 2    Pain Location Neck   Pain Orientation Right   Pain Descriptors / Indicators Aching      Therex.:  Reviewed HEP.  Standing RTB scap. Retraction/ tricep ext./ horizontal abduction/ shoulder flexion 10x2.   Supine shoulder flexion with wand 20x.  See new RTB ex.   Manual tx.:  Supine cervical UT/levator stretches 3x each direction (static holds).  STM to B UT musculature/ cervical  paraspinals.  Seated  posture correction with STM to R mid-low cervical paraspinals/ UT region.    Instructed pt. To ice R UT/neck if any soreness/ pain this evening.            PT Long Term Goals - 09/20/16 1713      PT LONG TERM GOAL #1   Title Pt will decrease QuickDASH score to <50% in order to decrease self-reported disability   Baseline 09/20/16: 68&   Time 4   Period Weeks   Status New     PT LONG TERM GOAL #2   Title Pt will report ability to work in her garden without increase in pain   Baseline unable/afraid to work in garden as of 8/20   Time 4   Period Weeks   Status New     PT LONG TERM GOAL #3   Title Pt will increase cervical lateral flexion to >45 deg bilaterally    Baseline R: 37 deg; L: 24 deg on 09/20/16   Time 4   Period Weeks   Status New     PT LONG TERM GOAL #4   Title Pt will report decreased tenderness upon palpation of R UT, levator scapular, and cervical paraspinals   Baseline (+) for tenderness   Time 4   Period  Weeks   Status New               Plan - 09/27/16 1249    Clinical Impression Statement R UT tenderness noted but able to tolerate manual tx./ stretches well.  No increase c/o pain with manual tx./ progression of ther.ex. program.  PT issued more RTB UE ex. with minimal cuing for proper technique/ posture correction.  Pt. instructed to ice R UT/neck if any soreness/ pain this evening.     Clinical Presentation Stable   Clinical Decision Making Moderate   Rehab Potential Good   PT Frequency 2x / week   PT Duration 4 weeks   PT Treatment/Interventions ADLs/Self Care Home Management;Cryotherapy;Moist Heat;Neuromuscular re-education;Therapeutic exercise;Therapeutic activities;Functional mobility training;Patient/family education;Manual techniques;Passive range of motion;Dry needling   PT Next Visit Plan Review HEP      Patient will benefit from skilled therapeutic intervention in order to improve the following deficits and impairments:   Hypomobility, Decreased activity tolerance, Decreased strength, Pain, Impaired UE functional use, Decreased mobility, Improper body mechanics, Decreased range of motion, Impaired flexibility, Postural dysfunction  Visit Diagnosis: Acute muscle stiffness of neck  Abnormal posture  Chronic right shoulder pain  Neck pain on right side     Problem List Patient Active Problem List   Diagnosis Date Noted  . Nocturnal leg cramps 08/16/2016  . Squamous cell lung cancer ([place]) 07/16/2016  . Hypothyroidism 07/16/2016  . Gait instability 07/16/2016  . Vitamin D deficiency 07/16/2016  . Osteoarthritis 07/16/2016   Pura Spice, PT, DPT # [id number] 09/28/2016, 5:20 PM  [place] [facility] [address] [place], [place], [address] Phone: [phone]   Fax:  [phone]  Name: Kimberly Burch MRN: [id number] Date of Birth: November 29, 1940

## 2016-10-05 ENCOUNTER — Ambulatory Visit: Payer: Medicare Other | Attending: Family Medicine | Admitting: Physical Therapy

## 2016-10-05 ENCOUNTER — Encounter: Payer: Self-pay | Admitting: Physical Therapy

## 2016-10-05 DIAGNOSIS — G8929 Other chronic pain: Secondary | ICD-10-CM | POA: Diagnosis present

## 2016-10-05 DIAGNOSIS — M542 Cervicalgia: Secondary | ICD-10-CM

## 2016-10-05 DIAGNOSIS — R293 Abnormal posture: Secondary | ICD-10-CM

## 2016-10-05 DIAGNOSIS — M25511 Pain in right shoulder: Secondary | ICD-10-CM | POA: Diagnosis present

## 2016-10-05 DIAGNOSIS — M436 Torticollis: Secondary | ICD-10-CM

## 2016-10-05 NOTE — Therapy (Signed)
[place] [facility] [address] [place], [place], [address] Phone: [phone]   Fax:  [phone]  Physical Therapy Treatment  Patient Details  Name: Kimberly Burch MRN: [id number] Date of Birth: [birth year] Referring Provider: Dr. Vicente Masson  Encounter Date: 10/05/2016      PT End of Session - 10/05/16 1308    Visit Number 3   Number of Visits 8   Date for PT Re-Evaluation 10/18/16   Authorization - Visit Number 3   Authorization - Number of Visits 10   PT Start Time 7782   PT Stop Time 1340   PT Time Calculation (min) 42 min   Activity Tolerance Patient tolerated treatment well   Behavior During Therapy [facility] for tasks assessed/performed      Past Medical History:  Diagnosis Date  . Cancer ([place]) 07/2014   lung cancer- couldn't do surgery. Radiation and chemo.   . Thyroid disease     Past Surgical History:  Procedure Laterality Date  . INNER EAR SURGERY     for hearing loss. L ear.  . TONSILECTOMY, ADENOIDECTOMY, BILATERAL MYRINGOTOMY AND TUBES    . TUBAL LIGATION      There were no vitals filed for this visit.      Subjective Assessment - 10/05/16 1300    Subjective Pt reports she has been waking up with a mild headache which she attributes to her UT pain which has been occurring since initial injury.  She is no longer having pain referring to R shoulder.     Pertinent History Pt. reports having R lung cancer 2 years ago and doing much better.     Limitations Lifting;House hold activities   Patient Stated Goals Increase R shoulder AROM/ pain-free mobility with daily tasks/ managing hair.     Currently in Pain? Yes   Pain Score 2    Pain Location Neck   Pain Orientation Posterior   Pain Descriptors / Indicators Aching   Pain Type Chronic pain   Multiple Pain Sites No       Therapeutic Exercise:  ?  Standing scapular retraction with 10# x30   Standing shoulder extension with 10# x30 each UE ?   Reviewed sitting  posture with scpular retractraction, cervical retraction, upright posture ?   Practiced picking up 10 lb weight from floor to simulate pt working in her yard at home. She is currently creating a patio with stone steps under her yard swing. Cues to bend her knees rather than flexing at her trunk. Cues to relax shoulders throughout.     Manual therapy: ?   STM to Bil UT, cervical paraspinals, Bil suboccipitals. Ischemic compression to Bil UT and subocciptals as trigger points identified.   Supine stretching to R UT 3x30 seconds   Supine contract relax to R UT with 5 second holds x10             PT Education - 10/05/16 1307    Education provided Yes   Education Details Exercise technique; reasoning behind interventions; education on UT pain with picture of UT   Person(s) Educated Patient   Methods Explanation;Demonstration   Comprehension Verbalized understanding;Returned demonstration;Need further instruction             PT Long Term Goals - 09/20/16 1713      PT LONG TERM GOAL #1   Title Pt will decrease QuickDASH score to <50% in order to decrease self-reported disability   Baseline 09/20/16: 68&   Time  4   Period Weeks   Status New     PT LONG TERM GOAL #2   Title Pt will report ability to work in her garden without increase in pain   Baseline unable/afraid to work in garden as of 8/20   Time 4   Period Weeks   Status New     PT LONG TERM GOAL #3   Title Pt will increase cervical lateral flexion to >45 deg bilaterally    Baseline R: 37 deg; L: 24 deg on 09/20/16   Time 4   Period Weeks   Status New     PT LONG TERM GOAL #4   Title Pt will report decreased tenderness upon palpation of R UT, levator scapular, and cervical paraspinals   Baseline (+) for tenderness   Time 4   Period Weeks   Status New               Plan - 10/05/16 1333    Clinical Impression Statement Pt reports tension headaches which is likely due to increased tension noted in  Bil UT, cervical paraspinals, and suboccipital musculature.  She responded well to [facility] to these regions.  Assessed pt's technique and posture with her garden work and sitting posture and cues provided/adjustments made to posture to decrease demand from Bil UTs.  Pt demonstrated good posture when performing therapeutic exercises with min verbal cues.  Pt will benefit from continued skilled PT interventions for improvement with posture, strength, functional mobility, and decreased pain.    Rehab Potential Good   PT Frequency 2x / week   PT Duration 4 weeks   PT Treatment/Interventions ADLs/Self Care Home Management;Cryotherapy;Moist Heat;Neuromuscular re-education;Therapeutic exercise;Therapeutic activities;Functional mobility training;Patient/family education;Manual techniques;Passive range of motion;Dry needling   PT Next Visit Plan Review HEP      Patient will benefit from skilled therapeutic intervention in order to improve the following deficits and impairments:  Hypomobility, Decreased activity tolerance, Decreased strength, Pain, Impaired UE functional use, Decreased mobility, Improper body mechanics, Decreased range of motion, Impaired flexibility, Postural dysfunction  Visit Diagnosis: Acute muscle stiffness of neck  Abnormal posture  Chronic right shoulder pain  Neck pain on right side     Problem List Patient Active Problem List   Diagnosis Date Noted  . Nocturnal leg cramps 08/16/2016  . Squamous cell lung cancer ([place]) 07/16/2016  . Hypothyroidism 07/16/2016  . Gait instability 07/16/2016  . Vitamin D deficiency 07/16/2016  . Osteoarthritis 07/16/2016    Collie Siad PT, DPT 10/05/2016, 1:41 PM  [facility] [facility] [address] [place], [place], [address] Phone: [phone]   Fax:  [phone]  Name: Kimberly Burch MRN: [id number] Date of Birth: August 06, 1940

## 2016-10-12 ENCOUNTER — Encounter: Payer: TRICARE For Life (TFL) | Admitting: Physical Therapy

## 2016-10-20 ENCOUNTER — Ambulatory Visit: Payer: Medicare Other | Admitting: Physical Therapy

## 2016-10-20 ENCOUNTER — Encounter: Payer: Self-pay | Admitting: Physical Therapy

## 2016-10-20 DIAGNOSIS — M436 Torticollis: Secondary | ICD-10-CM

## 2016-10-20 DIAGNOSIS — M542 Cervicalgia: Secondary | ICD-10-CM | POA: Diagnosis not present

## 2016-10-20 DIAGNOSIS — M25511 Pain in right shoulder: Secondary | ICD-10-CM

## 2016-10-20 DIAGNOSIS — G8929 Other chronic pain: Secondary | ICD-10-CM

## 2016-10-20 DIAGNOSIS — R293 Abnormal posture: Secondary | ICD-10-CM

## 2016-10-22 ENCOUNTER — Inpatient Hospital Stay: Payer: Medicare Other | Attending: Oncology

## 2016-10-22 DIAGNOSIS — Z85118 Personal history of other malignant neoplasm of bronchus and lung: Secondary | ICD-10-CM | POA: Insufficient documentation

## 2016-10-22 DIAGNOSIS — Z923 Personal history of irradiation: Secondary | ICD-10-CM | POA: Insufficient documentation

## 2016-10-22 DIAGNOSIS — Z452 Encounter for adjustment and management of vascular access device: Secondary | ICD-10-CM | POA: Insufficient documentation

## 2016-10-22 DIAGNOSIS — Z9221 Personal history of antineoplastic chemotherapy: Secondary | ICD-10-CM | POA: Insufficient documentation

## 2016-10-24 NOTE — Addendum Note (Signed)
Addended by: Pura Spice on: 10/24/2016 08:16 PM   Modules accepted: Orders

## 2016-10-24 NOTE — Therapy (Signed)
[facility] [address] [place], [place], [address] Phone: [phone]   Fax:  [phone]  Physical Therapy Treatment  Patient Details  Name: Kimberly Burch MRN: [id number] Date of Birth: 10-09-1940 Referring Provider: Dr. Vicente Masson  Encounter Date: 10/20/2016  Treatment 4 of 8.  Recert date: 28/31/51  Past Medical History:  Diagnosis Date  . Cancer ([place]) 07/2014   lung cancer- couldn't do surgery. Radiation and chemo.   . Thyroid disease     Past Surgical History:  Procedure Laterality Date  . INNER EAR SURGERY     for hearing loss. L ear.  . TONSILECTOMY, ADENOIDECTOMY, BILATERAL MYRINGOTOMY AND TUBES    . TUBAL LIGATION      There were no vitals filed for this visit.    Pt. reports a mild HA currently.  No c/o shoulder pain at this time.        Therapeutic Exercise:  ?  Standing scapular retraction with 10# x30  Standing shoulder extension with 10# x30 each UE ? Supine cervical isometrics (5x) all planes.  B shoulder horizontal abd./ adduction/ flexion AROM 10x. B shoulder isometrics (all planes).       Manual therapy: ?   STM in supine position to Bil UT, cervical paraspinals, Bil suboccipitals. Ischemic compression to Bil UT and subocciptals as trigger points identified.  STM with cervical AROM in seated posture.    Supine stretching to B UT 3x30 seconds  (static holds)        Pt demonstrates full cervical ROM with decreased lateral flexion bilaterally (R 38deg, L 32deg).  Pt. has progressed well with B UE strengthening ex. program.  Marked improvement in QuickDASH (22.7%) since initial evaluation.  Minimal tenderness with palpation of UT and levator scapulae with palpable trigger point, as well as cervical paraspinals with no palpable trigger point.  Pt. has been unable to return to bowling at this time.  Pt will benefit from continued skilled PT in order to increase pain-free ROM in order to allow  pt to participate in desired activities.        PT Long Term Goals - 10/24/16 2004      PT LONG TERM GOAL #1   Title Pt will decrease QuickDASH score to <50% in order to decrease self-reported disability   Baseline 09/20/16: 68%.  9/19: 22.7%   Time 4   Period Weeks   Status Achieved     PT LONG TERM GOAL #2   Title Pt will report ability to work in her garden without increase in pain   Baseline Slight difficulty/ increase cervical discomfort with increase activity.  Pt. able to complete in moderation.    Time 4   Period Weeks   Status Partially Met     PT LONG TERM GOAL #3   Title Pt will increase cervical lateral flexion to >45 deg bilaterally    Baseline progressing   Time 4   Period Weeks   Status Partially Met     PT LONG TERM GOAL #4   Title Pt will report decreased tenderness upon palpation of R UT, levator scapular, and cervical paraspinals   Baseline (+) for tenderness   Time 4   Period Weeks   Status On-going             Patient will benefit from skilled therapeutic intervention in order to improve the following deficits and impairments:  Hypomobility, Decreased activity tolerance, Decreased strength, Pain, Impaired UE functional use, Decreased  mobility, Improper body mechanics, Decreased range of motion, Impaired flexibility, Postural dysfunction  Visit Diagnosis: Acute muscle stiffness of neck  Abnormal posture  Chronic right shoulder pain  Neck pain on right side     Problem List Patient Active Problem List   Diagnosis Date Noted  . Nocturnal leg cramps 08/16/2016  . Squamous cell lung cancer ([place]) 07/16/2016  . Hypothyroidism 07/16/2016  . Gait instability 07/16/2016  . Vitamin D deficiency 07/16/2016  . Osteoarthritis 07/16/2016   Pura Spice, PT, DPT # [id number] 10/24/2016, 8:08 PM  [place] [facility] [address] [place], [place], [address] Phone: [phone]   Fax:  [phone]  Name:  Kimberly Burch MRN: [id number] Date of Birth: Sep 13, 1940

## 2016-10-26 ENCOUNTER — Inpatient Hospital Stay: Payer: Medicare Other

## 2016-10-26 VITALS — BP 114/75 | HR 92 | Temp 97.3°F | Resp 20

## 2016-10-26 DIAGNOSIS — Z923 Personal history of irradiation: Secondary | ICD-10-CM | POA: Diagnosis not present

## 2016-10-26 DIAGNOSIS — Z9221 Personal history of antineoplastic chemotherapy: Secondary | ICD-10-CM | POA: Diagnosis not present

## 2016-10-26 DIAGNOSIS — Z85118 Personal history of other malignant neoplasm of bronchus and lung: Secondary | ICD-10-CM | POA: Diagnosis not present

## 2016-10-26 DIAGNOSIS — Z452 Encounter for adjustment and management of vascular access device: Secondary | ICD-10-CM | POA: Diagnosis not present

## 2016-10-26 DIAGNOSIS — Z95828 Presence of other vascular implants and grafts: Secondary | ICD-10-CM

## 2016-10-26 MED ORDER — SODIUM CHLORIDE 0.9% FLUSH
10.0000 mL | INTRAVENOUS | Status: DC | PRN
  Administered 2016-10-26: 10 mL via INTRAVENOUS
  Filled 2016-10-26: qty 10

## 2016-10-26 MED ORDER — HEPARIN SOD (PORK) LOCK FLUSH 100 UNIT/ML IV SOLN
500.0000 [IU] | Freq: Once | INTRAVENOUS | Status: AC
Start: 2016-10-26 — End: 2016-10-26
  Administered 2016-10-26: 500 [IU] via INTRAVENOUS
  Filled 2016-10-26: qty 5

## 2016-11-01 ENCOUNTER — Encounter: Payer: Self-pay | Admitting: Family Medicine

## 2016-11-01 ENCOUNTER — Ambulatory Visit (INDEPENDENT_AMBULATORY_CARE_PROVIDER_SITE_OTHER): Payer: Medicare Other | Admitting: Family Medicine

## 2016-11-01 VITALS — BP 112/76 | HR 100 | Temp 97.5°F | Resp 16 | Ht 64.0 in | Wt 115.4 lb

## 2016-11-01 DIAGNOSIS — J069 Acute upper respiratory infection, unspecified: Secondary | ICD-10-CM

## 2016-11-01 DIAGNOSIS — C3491 Malignant neoplasm of unspecified part of right bronchus or lung: Secondary | ICD-10-CM | POA: Diagnosis not present

## 2016-11-01 DIAGNOSIS — J302 Other seasonal allergic rhinitis: Secondary | ICD-10-CM

## 2016-11-01 DIAGNOSIS — R2681 Unsteadiness on feet: Secondary | ICD-10-CM | POA: Diagnosis not present

## 2016-11-01 DIAGNOSIS — E559 Vitamin D deficiency, unspecified: Secondary | ICD-10-CM

## 2016-11-01 DIAGNOSIS — E039 Hypothyroidism, unspecified: Secondary | ICD-10-CM | POA: Diagnosis not present

## 2016-11-01 NOTE — Progress Notes (Signed)
Date:  11/01/2016   Name:  Kimberly Burch   DOB:  [birth year]   MRN:  [id number]  PCP:  Adline Potter, MD    Chief Complaint: Fatigue (Slept a lot this weekend. 99.3 temp over weekend. Wonders if it is vitamin issues. ); Allergies (Wants ears checked and throat. ); and Back Pain (lower back and rib pain. )   History of Present Illness:  This is a 76 y.o. female c/o 3d hx fatigue, LG fever, rhinorrhea, mild NP cough, sinus congestion. Has added Flonase to Claritin she takes for AR. Feeling better today. To see oncology in two weeks. Taking eye vitamins containing 2000 units vit D. PT helpful for trapezial strain and gait instability.  Review of Systems:  Review of Systems  HENT: Negative for ear pain and sore throat.   Respiratory: Negative for shortness of breath.   Cardiovascular: Negative for chest pain and leg swelling.  Gastrointestinal: Negative for abdominal pain.  Genitourinary: Negative for difficulty urinating.  Neurological: Negative for syncope and light-headedness.    Patient Active Problem List   Diagnosis Date Noted  . Nocturnal leg cramps 08/16/2016  . Squamous cell lung cancer ([place]) 07/16/2016  . Hypothyroidism 07/16/2016  . Gait instability 07/16/2016  . Vitamin D deficiency 07/16/2016  . Osteoarthritis 07/16/2016    Prior to Admission medications   Medication Sig Start Date End Date Taking? Authorizing Provider  dextromethorphan-guaiFENesin (MUCINEX DM) 30-600 MG 12hr tablet Take 1 tablet by mouth 2 (two) times daily as needed.   Yes [provider]  fluticasone (FLONASE) 50 MCG/ACT nasal spray Place 2 sprays into both nostrils daily.   Yes [provider]  levothyroxine (SYNTHROID, LEVOTHROID) 50 MCG tablet Take 50 mcg by mouth daily before breakfast.   Yes [provider]  loratadine (ALLERGY RELIEF) 10 MG tablet Take 10 mg by mouth daily.   Yes [provider]  Multiple Vitamins-Minerals (VISION FORMULA EYE HEALTH PO) Take  4 tablets by mouth daily.   Yes [provider]    No Known Allergies  Past Surgical History:  Procedure Laterality Date  . INNER EAR SURGERY     for hearing loss. L ear.  . TONSILECTOMY, ADENOIDECTOMY, BILATERAL MYRINGOTOMY AND TUBES    . TUBAL LIGATION      Social History  Substance Use Topics  . Smoking status: Former Research scientist (life sciences)  . Smokeless tobacco: Never Used  . Alcohol use No    Family History  Problem Relation Age of Onset  . Diabetes Father   . Hyperlipidemia Father   . Cancer Maternal Aunt   . Cancer Paternal Grandmother     Medication list has been reviewed and updated.  Physical Examination: BP 112/76   Pulse 100   Temp (!) 97.5 F (36.4 C)   Resp 16   Ht 5\' 4"  (1.626 m)   Wt 115 lb 6.4 oz (52.3 kg)   SpO2 100%   BMI 19.81 kg/m   Physical Exam  Constitutional: She appears well-developed and well-nourished.  HENT:  Right Ear: External ear normal.  Left Ear: External ear normal.  Nose: Nose normal.  Mouth/Throat: Oropharynx is clear and moist.  TMs clear  Neck: Neck supple.  Cardiovascular: Normal rate, regular rhythm and normal heart sounds.   Pulmonary/Chest: Effort normal.  Diffuse crackles R lung  Musculoskeletal: She exhibits no edema.  Lymphadenopathy:    She has no cervical adenopathy.  Neurological: She is alert.  Skin: Skin is warm and dry.  Psychiatric: She has  a normal mood and affect. Her behavior is normal.  Nursing note and vitals reviewed.   Assessment and Plan:  1. Viral URI Improving, sx rx discussed  2. Seasonal allergic rhinitis, unspecified trigger Cont Claritin/Flonase  3. Squamous cell carcinoma of right lung (HCC) With chronic R lung changes, for oncology f/u this month  4. Hypothyroidism, unspecified type Well controlled on Synthroid  5. Gait instability Improved s/p PT, encouraged home exercises  6. Vitamin D deficiency Sufficient supplementation with eye vits  Return in about 8 months (around  07/02/2017), or if symptoms worsen or fail to improve.  Satira Anis. [facility]  11/01/2016

## 2016-11-01 NOTE — Patient Instructions (Signed)
Upper Respiratory Infection, Adult Most upper respiratory infections (URIs) are a viral infection of the air passages leading to the lungs. A URI affects the nose, throat, and upper air passages. The most common type of URI is nasopharyngitis and is typically referred to as "the common cold." URIs run their course and usually go away on their own. Most of the time, a URI does not require medical attention, but sometimes a bacterial infection in the upper airways can follow a viral infection. This is called a secondary infection. Sinus and middle ear infections are common types of secondary upper respiratory infections. Bacterial pneumonia can also complicate a URI. A URI can worsen asthma and chronic obstructive pulmonary disease (COPD). Sometimes, these complications can require emergency medical care and may be life threatening. What are the causes? Almost all URIs are caused by viruses. A virus is a type of germ and can spread from one person to another. What increases the risk? You may be at risk for a URI if:  You smoke.  You have chronic heart or lung disease.  You have a weakened defense (immune) system.  You are very young or very old.  You have nasal allergies or asthma.  You work in crowded or poorly ventilated areas.  You work in health care facilities or schools.  What are the signs or symptoms? Symptoms typically develop 2-3 days after you come in contact with a cold virus. Most viral URIs last 7-10 days. However, viral URIs from the influenza virus (flu virus) can last 14-18 days and are typically more severe. Symptoms may include:  Runny or stuffy (congested) nose.  Sneezing.  Cough.  Sore throat.  Headache.  Fatigue.  Fever.  Loss of appetite.  Pain in your forehead, behind your eyes, and over your cheekbones (sinus pain).  Muscle aches.  How is this diagnosed? Your health care provider may diagnose a URI by:  Physical exam.  Tests to check that your  symptoms are not due to another condition such as: ? Strep throat. ? Sinusitis. ? Pneumonia. ? Asthma.  How is this treated? A URI goes away on its own with time. It cannot be cured with medicines, but medicines may be prescribed or recommended to relieve symptoms. Medicines may help:  Reduce your fever.  Reduce your cough.  Relieve nasal congestion.  Follow these instructions at home:  Take medicines only as directed by your health care provider.  Gargle warm saltwater or take cough drops to comfort your throat as directed by your health care provider.  Use a warm mist humidifier or inhale steam from a shower to increase air moisture. This may make it easier to breathe.  Drink enough fluid to keep your urine clear or pale yellow.  Eat soups and other clear broths and maintain good nutrition.  Rest as needed.  Return to work when your temperature has returned to normal or as your health care provider advises. You may need to stay home longer to avoid infecting others. You can also use a face mask and careful hand washing to prevent spread of the virus.  Increase the usage of your inhaler if you have asthma.  Do not use any tobacco products, including cigarettes, chewing tobacco, or electronic cigarettes. If you need help quitting, ask your health care provider. How is this prevented? The best way to protect yourself from getting a cold is to practice good hygiene.  Avoid oral or hand contact with people with cold symptoms.  Wash your   hands often if contact occurs.  There is no clear evidence that vitamin C, vitamin E, echinacea, or exercise reduces the chance of developing a cold. However, it is always recommended to get plenty of rest, exercise, and practice good nutrition. Contact a health care provider if:  You are getting worse rather than better.  Your symptoms are not controlled by medicine.  You have chills.  You have worsening shortness of breath.  You have  brown or red mucus.  You have yellow or brown nasal discharge.  You have pain in your face, especially when you bend forward.  You have a fever.  You have swollen neck glands.  You have pain while swallowing.  You have white areas in the back of your throat. Get help right away if:  You have severe or persistent: ? Headache. ? Ear pain. ? Sinus pain. ? Chest pain.  You have chronic lung disease and any of the following: ? Wheezing. ? Prolonged cough. ? Coughing up blood. ? A change in your usual mucus.  You have a stiff neck.  You have changes in your: ? Vision. ? Hearing. ? Thinking. ? Mood. This information is not intended to replace advice given to you by your health care provider. Make sure you discuss any questions you have with your health care provider. Document Released: 07/14/2000 Document Revised: 09/21/2015 Document Reviewed: 04/25/2013 Elsevier Interactive Patient Education  2017 Elsevier Inc.  

## 2016-11-10 ENCOUNTER — Ambulatory Visit (INDEPENDENT_AMBULATORY_CARE_PROVIDER_SITE_OTHER): Payer: Medicare Other

## 2016-11-10 DIAGNOSIS — Z23 Encounter for immunization: Secondary | ICD-10-CM | POA: Diagnosis not present

## 2016-11-15 ENCOUNTER — Telehealth: Payer: Self-pay

## 2016-11-15 ENCOUNTER — Ambulatory Visit
Admission: RE | Admit: 2016-11-15 | Discharge: 2016-11-15 | Disposition: A | Discharge status: Home / Self Care | Payer: Medicare Other | Source: Ambulatory Visit | Attending: Oncology | Admitting: Oncology

## 2016-11-15 ENCOUNTER — Ambulatory Visit: Admission: RE | Admit: 2016-11-15 | Payer: Medicare Other | Source: Ambulatory Visit

## 2016-11-15 ENCOUNTER — Inpatient Hospital Stay: Payer: Medicare Other | Attending: Oncology

## 2016-11-15 ENCOUNTER — Other Ambulatory Visit: Payer: Self-pay | Admitting: Family Medicine

## 2016-11-15 DIAGNOSIS — Z9221 Personal history of antineoplastic chemotherapy: Secondary | ICD-10-CM | POA: Diagnosis not present

## 2016-11-15 DIAGNOSIS — C3491 Malignant neoplasm of unspecified part of right bronchus or lung: Secondary | ICD-10-CM | POA: Diagnosis present

## 2016-11-15 DIAGNOSIS — I700 Atherosclerosis of aorta: Secondary | ICD-10-CM | POA: Diagnosis not present

## 2016-11-15 DIAGNOSIS — J439 Emphysema, unspecified: Secondary | ICD-10-CM | POA: Insufficient documentation

## 2016-11-15 DIAGNOSIS — Z923 Personal history of irradiation: Secondary | ICD-10-CM | POA: Insufficient documentation

## 2016-11-15 DIAGNOSIS — E079 Disorder of thyroid, unspecified: Secondary | ICD-10-CM | POA: Diagnosis not present

## 2016-11-15 DIAGNOSIS — Z79899 Other long term (current) drug therapy: Secondary | ICD-10-CM | POA: Insufficient documentation

## 2016-11-15 DIAGNOSIS — Z85118 Personal history of other malignant neoplasm of bronchus and lung: Secondary | ICD-10-CM | POA: Diagnosis present

## 2016-11-15 DIAGNOSIS — Z87891 Personal history of nicotine dependence: Secondary | ICD-10-CM | POA: Diagnosis not present

## 2016-11-15 DIAGNOSIS — R05 Cough: Secondary | ICD-10-CM | POA: Insufficient documentation

## 2016-11-15 DIAGNOSIS — Z809 Family history of malignant neoplasm, unspecified: Secondary | ICD-10-CM | POA: Diagnosis not present

## 2016-11-15 DIAGNOSIS — K8020 Calculus of gallbladder without cholecystitis without obstruction: Secondary | ICD-10-CM | POA: Diagnosis not present

## 2016-11-15 LAB — CREATININE, SERUM
CREATININE: 0.74 mg/dL (ref 0.44–1.00)
GFR calc Af Amer: >60 mL/min (ref >60)

## 2016-11-15 MED ORDER — IOPAMIDOL (ISOVUE-300) INJECTION 61%
75.0000 mL | Freq: Once | INTRAVENOUS | Status: AC | PRN
  Administered 2016-11-15: 75 mL via INTRAVENOUS

## 2016-11-15 MED ORDER — LEVOTHYROXINE SODIUM 50 MCG PO TABS: 50.0000 ug | ORAL_TABLET | Freq: Every day | ORAL | 3 refills | Status: DC

## 2016-11-15 NOTE — Telephone Encounter (Signed)
Needs refill Synthroid

## 2016-11-15 NOTE — Telephone Encounter (Signed)
Done

## 2016-11-16 NOTE — Progress Notes (Signed)
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 She continues to feel well and is asymptomatic. She has no neurologic complaints. She denies any recent fevers or illnesses. She has a good appetite and denies weight loss. She has no chest pain, shortness of breath, cough, or hemoptysis. She denies any nausea, vomiting, constipation, or diarrhea. She has no urinary complaints. Patient feels at her baseline and offers no specific complaints today.  REVIEW OF SYSTEMS:   Review of Systems  Constitutional: Negative.  Negative for fever, malaise/fatigue and weight loss.  Respiratory: Positive for cough. Negative for hemoptysis and shortness of breath.   Cardiovascular: Negative.  Negative for chest pain and leg swelling.  Gastrointestinal: Negative.  Negative for abdominal pain.  Genitourinary: Negative.   Musculoskeletal: Negative.   Skin: Negative.  Negative for rash.  Neurological: Negative.  Negative for sensory change and weakness.  Psychiatric/Behavioral: Negative.  The patient is not nervous/anxious.     As per HPI. Otherwise, a complete review of systems is negative.  PAST MEDICAL HISTORY: Past Medical History:  Diagnosis Date  . Cancer ([place]) 07/2014   lung cancer- couldn't do surgery. Radiation and chemo.   . Thyroid disease     PAST SURGICAL HISTORY: Past Surgical History:  Procedure Laterality Date  . INNER EAR SURGERY     for hearing loss. L ear.  . TONSILECTOMY, ADENOIDECTOMY, BILATERAL MYRINGOTOMY AND TUBES    . TUBAL LIGATION      FAMILY HISTORY: Family History  Problem Relation Age of Onset  . Diabetes Father   .  Hyperlipidemia Father   . Cancer Maternal Aunt   . Cancer Paternal Grandmother     ADVANCED DIRECTIVES (Y/N):  N  HEALTH MAINTENANCE: Social History  Substance Use Topics  . Smoking status: Former Research scientist (life sciences)  . Smokeless tobacco: Never Used  . Alcohol use No     Colonoscopy:  PAP:  Bone density:  Lipid panel:  No Known Allergies  Current Outpatient Prescriptions  Medication Sig Dispense Refill  . dextromethorphan-guaiFENesin (MUCINEX DM) 30-600 MG 12hr tablet Take 1 tablet by mouth 2 (two) times daily as needed.    . fluticasone (FLONASE) 50 MCG/ACT nasal spray Place 2 sprays into both nostrils daily.    Marland Kitchen levothyroxine (SYNTHROID, LEVOTHROID) 50 MCG tablet Take 1 tablet (50 mcg total) by mouth daily before breakfast. 90 tablet 3  . loratadine (ALLERGY RELIEF) 10 MG tablet Take 10 mg by mouth daily.    . Multiple Vitamins-Minerals (VISION FORMULA EYE HEALTH PO) Take 4 tablets by mouth daily.     No current facility-administered medications for this visit.     OBJECTIVE: Vitals:   11/19/16 1045  BP: 113/76  Pulse: 93  Resp: 18  Temp: (!) 96.6 F (35.9 C)     Body mass index is 19.9 kg/m.    ECOG FS:0 - Asymptomatic  General: Well-developed, well-nourished, no acute distress. Eyes: Pink conjunctiva, anicteric sclera. Lungs: Clear to auscultation bilaterally. Heart: Regular rate and rhythm. No rubs, murmurs, or gallops. Abdomen: Soft, nontender, nondistended. No organomegaly noted, normoactive bowel sounds. Musculoskeletal: No edema, cyanosis, or clubbing. Neuro: Alert, answering all questions appropriately. Cranial nerves grossly intact. Skin: No rashes or  petechiae noted. Psych: Normal affect.   LAB RESULTS:  Lab Results  Component Value Date   NA 142 07/16/2016   K 5.1 07/16/2016   CL 98 07/16/2016   CO2 28 07/16/2016   GLUCOSE 77 07/16/2016   BUN 13 07/16/2016   CREATININE 0.74 11/15/2016   CALCIUM 9.8 07/16/2016   PROT 7.3 07/16/2016   ALBUMIN 4.6  07/16/2016   AST 29 07/16/2016   ALT 20 07/16/2016   ALKPHOS 117 07/16/2016   BILITOT 0.5 07/16/2016   GFRNONAA >60 11/15/2016   GFRAA >60 11/15/2016    Lab Results  Component Value Date   WBC 5.9 07/16/2016   HGB 14.1 07/16/2016   HCT 42.7 07/16/2016   MCV 87 07/16/2016   PLT 223 07/16/2016     STUDIES: Ct Chest W Contrast  Result Date: 11/15/2016 CLINICAL DATA:  History of right lung squamous cell carcinoma with chemotherapy and radiation therapy. No previous relevant surgery. No current complaints. EXAM: CT CHEST WITH CONTRAST TECHNIQUE: Multidetector CT imaging of the chest was performed during intravenous contrast administration. CONTRAST:  60mL ISOVUE-300 IOPAMIDOL (ISOVUE-300) INJECTION 61% COMPARISON:  None. FINDINGS: Cardiovascular: There is atherosclerosis of the aorta, great vessels and coronary arteries. No acute vascular findings are demonstrated. Left subclavian Port-A-Cath extends to the mid SVC. The heart size is normal. There is a small amount of fluid in the superior pericardial recess. Mediastinum/Nodes: There are no enlarged mediastinal, hilar or axillary lymph nodes. 9 mm AP window node noted on image 48.There is volume loss in the right hemithorax with mediastinal shift to the right. The thyroid gland, trachea and esophagus demonstrate no significant findings. Lungs/Pleura: There is a small complex right pleural effusion which appears loculated with increased density. There is no significant pleural fluid on the left. As above, there is volume loss in the right hemithorax with complete opacification and destruction of the right middle and lower lobes. There is associated central bronchiectasis and cavitation. A large right lower lobe cavity communicates with the lower lobe bronchus, measuring up to 7.2 x 4.0 x 4.5 cm. Mild emphysema is present within the aerated portions of the lungs. There is some paramediastinal opacity in the left lung, likely related to radiation  therapy. No discrete residual mass lesion identified. Upper abdomen: 10 mm low-density lesion posteriorly in the right hepatic lobe is noted on image 114. This could reflect a hemangioma, although is not entirely specific. There is a small left adrenal nodule, measuring 12 x 11 mm. The right adrenal gland appears normal. Multiple calcified gallstones are present. There are mildly prominent lymph nodes in the porta hepatis, nonspecific. Musculoskeletal/Chest wall: There is no chest wall mass or suspicious osseous finding. IMPRESSION: 1. Presumed post treatment changes in the right hemithorax with volume loss, parenchymal opacity and cavitation, and complicated right pleural fluid. No obvious residual or recurrent tumor. Comparison with prior imaging and/or follow-up recommended. 2. Indeterminate low-density right hepatic lesion and small left adrenal nodule. Again, comparison with prior studies would be helpful. 3. Aortic Atherosclerosis (ICD10-I70.0) and Emphysema (ICD10-J43.9). 4. Cholelithiasis. Electronically Signed   By: Richardean Sale M.D.   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 She then received 2 cycles of consolidation treatment completing on November 15, 2014. She had a bronchoscopy performed on March 02, 2016  which did not reveal any signs of recurrent or progressive disease. Her most recent CT scan on November 15, 2016 reviewed independently and reported as above with no obvious residual or recurrent disease.  No intervention is needed at this time. Plan on doing CT scans every 6 months until patient is 3 years removed from completing treatment in October 2019. At that point, patient can be switched to yearly imaging. Return to clinic in 6 months with repeat imaging and further evaluation. 2. History  of hemoptysis: CT scan as above. Will consider a referral to pulmonology if necessary in the future. 3. Port: Patient will require port flushes every 6 weeks.  Approximately 30 minutes was spent in discussion of which greater than 50% was consultation.  Patient expressed understanding and was in agreement with this plan. She also understands that She can call clinic at any time with any questions, concerns, or complaints.   Cancer Staging Squamous cell lung cancer [facility]) Staging form: Lung, AJCC 8th Edition - Clinical stage from 07/30/2016: Stage IIIB (cT4, cN2, cM0) - Signed by Lloyd Huger, MD on 07/30/2016   Lloyd Huger, MD   11/19/2016 2:32 PM

## 2016-11-19 ENCOUNTER — Inpatient Hospital Stay (HOSPITAL_BASED_OUTPATIENT_CLINIC_OR_DEPARTMENT_OTHER): Payer: Medicare Other | Admitting: Oncology

## 2016-11-19 VITALS — BP 113/76 | HR 93 | Temp 96.6°F | Resp 18 | Wt 116.0 lb

## 2016-11-19 DIAGNOSIS — Z85118 Personal history of other malignant neoplasm of bronchus and lung: Secondary | ICD-10-CM | POA: Diagnosis not present

## 2016-11-19 DIAGNOSIS — Z9221 Personal history of antineoplastic chemotherapy: Secondary | ICD-10-CM

## 2016-11-19 DIAGNOSIS — R05 Cough: Secondary | ICD-10-CM | POA: Diagnosis not present

## 2016-11-19 DIAGNOSIS — Z923 Personal history of irradiation: Secondary | ICD-10-CM | POA: Diagnosis not present

## 2016-11-19 DIAGNOSIS — I700 Atherosclerosis of aorta: Secondary | ICD-10-CM

## 2016-11-19 DIAGNOSIS — E079 Disorder of thyroid, unspecified: Secondary | ICD-10-CM

## 2016-11-19 DIAGNOSIS — Z79899 Other long term (current) drug therapy: Secondary | ICD-10-CM

## 2016-11-19 DIAGNOSIS — C3491 Malignant neoplasm of unspecified part of right bronchus or lung: Secondary | ICD-10-CM

## 2016-11-19 DIAGNOSIS — Z809 Family history of malignant neoplasm, unspecified: Secondary | ICD-10-CM | POA: Diagnosis not present

## 2016-11-19 DIAGNOSIS — Z87891 Personal history of nicotine dependence: Secondary | ICD-10-CM | POA: Diagnosis not present

## 2016-11-19 NOTE — Progress Notes (Signed)
Patient denies any concerns today.  

## 2016-12-03 ENCOUNTER — Inpatient Hospital Stay: Payer: Medicare Other | Attending: Oncology

## 2016-12-10 ENCOUNTER — Inpatient Hospital Stay: Payer: Medicare Other | Attending: Oncology

## 2016-12-10 DIAGNOSIS — Z923 Personal history of irradiation: Secondary | ICD-10-CM | POA: Diagnosis not present

## 2016-12-10 DIAGNOSIS — Z452 Encounter for adjustment and management of vascular access device: Secondary | ICD-10-CM | POA: Insufficient documentation

## 2016-12-10 DIAGNOSIS — Z9221 Personal history of antineoplastic chemotherapy: Secondary | ICD-10-CM | POA: Diagnosis not present

## 2016-12-10 DIAGNOSIS — Z85118 Personal history of other malignant neoplasm of bronchus and lung: Secondary | ICD-10-CM | POA: Insufficient documentation

## 2016-12-10 DIAGNOSIS — Z95828 Presence of other vascular implants and grafts: Secondary | ICD-10-CM

## 2016-12-10 MED ORDER — SODIUM CHLORIDE 0.9% FLUSH
10.0000 mL | INTRAVENOUS | Status: DC | PRN
  Administered 2016-12-10: 10 mL via INTRAVENOUS
  Filled 2016-12-10: qty 10

## 2016-12-10 MED ORDER — HEPARIN SOD (PORK) LOCK FLUSH 100 UNIT/ML IV SOLN
500.0000 [IU] | Freq: Once | INTRAVENOUS | Status: AC
  Administered 2016-12-10: 500 [IU] via INTRAVENOUS
  Filled 2016-12-10: qty 5

## 2017-01-21 ENCOUNTER — Inpatient Hospital Stay: Payer: Medicare Other | Attending: Oncology

## 2017-01-21 DIAGNOSIS — Z9221 Personal history of antineoplastic chemotherapy: Secondary | ICD-10-CM | POA: Insufficient documentation

## 2017-01-21 DIAGNOSIS — Z95828 Presence of other vascular implants and grafts: Secondary | ICD-10-CM

## 2017-01-21 DIAGNOSIS — Z85118 Personal history of other malignant neoplasm of bronchus and lung: Secondary | ICD-10-CM | POA: Diagnosis not present

## 2017-01-21 DIAGNOSIS — Z923 Personal history of irradiation: Secondary | ICD-10-CM | POA: Diagnosis not present

## 2017-01-21 DIAGNOSIS — Z452 Encounter for adjustment and management of vascular access device: Secondary | ICD-10-CM | POA: Insufficient documentation

## 2017-01-21 MED ORDER — HEPARIN SOD (PORK) LOCK FLUSH 100 UNIT/ML IV SOLN
500.0000 [IU] | Freq: Once | INTRAVENOUS | Status: AC
  Administered 2017-01-21: 500 [IU] via INTRAVENOUS

## 2017-01-21 MED ORDER — SODIUM CHLORIDE 0.9% FLUSH
10.0000 mL | INTRAVENOUS | Status: DC | PRN
Start: 2017-01-21 — End: 2017-01-21
  Administered 2017-01-21: 10 mL via INTRAVENOUS
  Filled 2017-01-21: qty 10

## 2017-01-26 ENCOUNTER — Ambulatory Visit (INDEPENDENT_AMBULATORY_CARE_PROVIDER_SITE_OTHER): Payer: Medicare Other | Admitting: Family Medicine

## 2017-01-26 ENCOUNTER — Encounter: Payer: Self-pay | Admitting: Family Medicine

## 2017-01-26 ENCOUNTER — Ambulatory Visit
Admission: RE | Admit: 2017-01-26 | Discharge: 2017-01-26 | Disposition: A | Discharge status: Home / Self Care | Payer: Medicare Other | Source: Ambulatory Visit | Attending: Family Medicine | Admitting: Family Medicine

## 2017-01-26 VITALS — BP 112/79 | HR 114 | Temp 97.8°F | Resp 16 | Ht 64.0 in | Wt 117.0 lb

## 2017-01-26 DIAGNOSIS — R918 Other nonspecific abnormal finding of lung field: Secondary | ICD-10-CM | POA: Diagnosis not present

## 2017-01-26 DIAGNOSIS — J309 Allergic rhinitis, unspecified: Secondary | ICD-10-CM

## 2017-01-26 DIAGNOSIS — R0602 Shortness of breath: Secondary | ICD-10-CM

## 2017-01-26 DIAGNOSIS — C3491 Malignant neoplasm of unspecified part of right bronchus or lung: Secondary | ICD-10-CM

## 2017-01-26 DIAGNOSIS — E039 Hypothyroidism, unspecified: Secondary | ICD-10-CM

## 2017-01-26 DIAGNOSIS — J8410 Pulmonary fibrosis, unspecified: Secondary | ICD-10-CM | POA: Diagnosis not present

## 2017-01-26 MED ORDER — LEVOFLOXACIN 500 MG PO TABS: 500.0000 mg | ORAL_TABLET | Freq: Every day | ORAL | 0 refills | Status: DC

## 2017-01-26 MED ORDER — PREDNISONE 20 MG PO TABS: 20.0000 mg | ORAL_TABLET | Freq: Every day | ORAL | 0 refills | Status: DC

## 2017-01-26 NOTE — Progress Notes (Signed)
Date:  01/26/2017   Name:  Kimberly Burch   DOB:  12/02/1940   MRN:  [id number]  PCP:  Adline Potter, MD    Chief Complaint: Cough (Hx Lung Cancver on R Lung and having hard time on L side now. Crying and nervous and having SOB and troubnle with cough. Has been sick since Sunday. She is coughing bad when trying to sleep. )   History of Present Illness:  This is a 76 y.o. female seen for same day visit. Known SCC R lung s/p CTX/XRT in 2016, CT stable in October. C/o one week hx URI sxs with NP cough and malaise, 2d ago became more SOB and developed L side chest pain. Hx pneumonia in past, R pleural effusion and emphysema noted on CT in October.  Review of Systems:  Review of Systems  Constitutional: Negative for chills and fever.  HENT: Negative for sore throat.   Cardiovascular: Negative for leg swelling.  Gastrointestinal: Negative for abdominal pain.  Genitourinary: Negative for difficulty urinating.  Neurological: Negative for syncope and light-headedness.    Patient Active Problem List   Diagnosis Date Noted  . Allergic rhinitis 01/26/2017  . Nocturnal leg cramps 08/16/2016  . Squamous cell lung cancer ([place]) 07/16/2016  . Hypothyroidism 07/16/2016  . Gait instability 07/16/2016  . Vitamin D deficiency 07/16/2016  . Osteoarthritis 07/16/2016    Prior to Admission medications   Medication Sig Start Date End Date Taking? Authorizing Provider  dextromethorphan-guaiFENesin (MUCINEX DM) 30-600 MG 12hr tablet Take 1 tablet by mouth 2 (two) times daily as needed.   Yes [provider]  fluticasone (FLONASE) 50 MCG/ACT nasal spray Place 2 sprays into both nostrils daily.   Yes [provider]  levothyroxine (SYNTHROID, LEVOTHROID) 50 MCG tablet Take 1 tablet (50 mcg total) by mouth daily before breakfast. 11/15/16  Yes Sheronda Parran, Gwyndolyn Saxon, MD  loratadine (ALLERGY RELIEF) 10 MG tablet Take 10 mg by mouth daily.   Yes [provider]  Multiple Vitamins-Minerals  (VISION FORMULA EYE HEALTH PO) Take 4 tablets by mouth daily.   Yes [provider]  levofloxacin (LEVAQUIN) 500 MG tablet Take 1 tablet (500 mg total) by mouth daily. 01/26/17   Windy Dudek, Gwyndolyn Saxon, MD  predniSONE (DELTASONE) 20 MG tablet Take 1 tablet (20 mg total) by mouth daily with breakfast. 01/26/17   Adline Potter, MD    No Known Allergies  Past Surgical History:  Procedure Laterality Date  . INNER EAR SURGERY     for hearing loss. L ear.  . TONSILECTOMY, ADENOIDECTOMY, BILATERAL MYRINGOTOMY AND TUBES    . TUBAL LIGATION      Social History   Tobacco Use  . Smoking status: Former Research scientist (life sciences)  . Smokeless tobacco: Never Used  Substance Use Topics  . Alcohol use: No  . Drug use: No    Family History  Problem Relation Age of Onset  . Diabetes Father   . Hyperlipidemia Father   . Cancer Maternal Aunt   . Cancer Paternal Grandmother     Medication list has been reviewed and updated.  Physical Examination: BP 112/79   Pulse (!) 114   Temp 97.8 F (36.6 C)   Resp 16   Ht 5\' 4"  (1.626 m)   Wt 117 lb (53.1 kg)   BMI 20.08 kg/m  Pulse ox 98% Physical Exam  Constitutional: She is oriented to person, place, and time. She appears well-developed and well-nourished. No distress.  HENT:  Mouth/Throat: Oropharynx is clear and moist.  Neck:  Neck supple. No thyromegaly present.  Cardiovascular: Normal rate, regular rhythm and normal heart sounds.  Pulmonary/Chest:  Tachypnea with diffuse rales/rhonchi R lung, L lung clear  Musculoskeletal: She exhibits no edema.  Lymphadenopathy:    She has no cervical adenopathy.  Neurological: She is alert and oriented to person, place, and time.  Skin: Skin is warm and dry. She is not diaphoretic.  Psychiatric: Her behavior is normal.  Anxious  Nursing note and vitals reviewed.   Assessment and Plan:  1. Shortness of breath Begin Levaquin/prednisone x 7d given sxs/risks - DG Chest 2 View; Future  2. Squamous cell  carcinoma of right lung (HCC) S/p CTX/XRT, followed by oncology  3. Hypothyroidism, unspecified type Well controlled on Synthroid  4. Allergic rhinitis, unspecified seasonality, unspecified trigger Well controlled on Claritin/Flonase  Return if symptoms worsen or fail to improve.  Satira Anis. [facility]  01/26/2017

## 2017-03-04 ENCOUNTER — Inpatient Hospital Stay: Payer: Medicare Other | Attending: Oncology

## 2017-03-04 DIAGNOSIS — Z95828 Presence of other vascular implants and grafts: Secondary | ICD-10-CM

## 2017-03-04 MED ORDER — SODIUM CHLORIDE 0.9% FLUSH
10.0000 mL | INTRAVENOUS | Status: DC | PRN
  Administered 2017-03-04: 10 mL via INTRAVENOUS
  Filled 2017-03-04: qty 10

## 2017-03-04 MED ORDER — HEPARIN SOD (PORK) LOCK FLUSH 100 UNIT/ML IV SOLN
500.0000 [IU] | Freq: Once | INTRAVENOUS | Status: AC
  Administered 2017-03-04: 500 [IU] via INTRAVENOUS
  Filled 2017-03-04: qty 5

## 2017-04-25 ENCOUNTER — Other Ambulatory Visit: Payer: Self-pay | Admitting: Oncology

## 2017-04-25 DIAGNOSIS — C3490 Malignant neoplasm of unspecified part of unspecified bronchus or lung: Secondary | ICD-10-CM

## 2017-05-16 ENCOUNTER — Inpatient Hospital Stay: Payer: Medicare Other | Attending: Oncology

## 2017-05-16 ENCOUNTER — Ambulatory Visit
Admission: RE | Admit: 2017-05-16 | Discharge: 2017-05-16 | Disposition: A | Discharge status: Home / Self Care | Payer: Medicare Other | Source: Ambulatory Visit | Attending: Oncology | Admitting: Oncology

## 2017-05-16 DIAGNOSIS — I700 Atherosclerosis of aorta: Secondary | ICD-10-CM | POA: Diagnosis not present

## 2017-05-16 DIAGNOSIS — Z87891 Personal history of nicotine dependence: Secondary | ICD-10-CM | POA: Insufficient documentation

## 2017-05-16 DIAGNOSIS — Z85828 Personal history of other malignant neoplasm of skin: Secondary | ICD-10-CM | POA: Diagnosis not present

## 2017-05-16 DIAGNOSIS — K769 Liver disease, unspecified: Secondary | ICD-10-CM | POA: Insufficient documentation

## 2017-05-16 DIAGNOSIS — Z9221 Personal history of antineoplastic chemotherapy: Secondary | ICD-10-CM | POA: Diagnosis not present

## 2017-05-16 DIAGNOSIS — J984 Other disorders of lung: Secondary | ICD-10-CM | POA: Insufficient documentation

## 2017-05-16 DIAGNOSIS — E079 Disorder of thyroid, unspecified: Secondary | ICD-10-CM | POA: Insufficient documentation

## 2017-05-16 DIAGNOSIS — C3491 Malignant neoplasm of unspecified part of right bronchus or lung: Secondary | ICD-10-CM | POA: Insufficient documentation

## 2017-05-16 DIAGNOSIS — Z85118 Personal history of other malignant neoplasm of bronchus and lung: Secondary | ICD-10-CM | POA: Diagnosis not present

## 2017-05-16 DIAGNOSIS — K8020 Calculus of gallbladder without cholecystitis without obstruction: Secondary | ICD-10-CM | POA: Insufficient documentation

## 2017-05-16 DIAGNOSIS — L989 Disorder of the skin and subcutaneous tissue, unspecified: Secondary | ICD-10-CM | POA: Diagnosis not present

## 2017-05-16 DIAGNOSIS — Z923 Personal history of irradiation: Secondary | ICD-10-CM | POA: Insufficient documentation

## 2017-05-16 DIAGNOSIS — C3490 Malignant neoplasm of unspecified part of unspecified bronchus or lung: Secondary | ICD-10-CM

## 2017-05-16 LAB — CREATININE, SERUM
Creatinine, Ser: 0.72 mg/dL (ref 0.44–1.00)
GFR calc non Af Amer: >60 mL/min (ref >60)

## 2017-05-16 MED ORDER — IOPAMIDOL (ISOVUE-300) INJECTION 61%
75.0000 mL | Freq: Once | INTRAVENOUS | Status: AC | PRN
  Administered 2017-05-16: 75 mL via INTRAVENOUS

## 2017-05-16 NOTE — Progress Notes (Signed)
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  She currently feels well and is at her baseline.  She is worried about her thyroid levels since she has not been able to be seen by her primary care recently.  She has no neurologic complaints. She denies any recent fevers or illnesses. She has a good appetite and denies weight loss. She has no chest pain, shortness of breath, cough, or hemoptysis. She denies any nausea, vomiting, constipation, or diarrhea. She has no urinary complaints.  Patient offers no specific complaints today.  REVIEW OF SYSTEMS:   Review of Systems  Constitutional: Negative.  Negative for fever, malaise/fatigue and weight loss.  Respiratory: Negative.  Negative for cough, hemoptysis and shortness of breath.   Cardiovascular: Negative.  Negative for chest pain and leg swelling.  Gastrointestinal: Negative.  Negative for abdominal pain.  Genitourinary: Negative.   Musculoskeletal: Negative.   Skin: Negative.  Negative for rash.  Neurological: Negative.  Negative for sensory change, focal weakness and weakness.  Psychiatric/Behavioral: Negative.  The patient is not nervous/anxious.     As per HPI. Otherwise, a complete review of systems is negative.  PAST MEDICAL HISTORY: Past Medical History:  Diagnosis Date  . Cancer ([place]) 07/2014   lung cancer- couldn't do surgery. Radiation and chemo.   . Thyroid disease     PAST SURGICAL HISTORY: Past Surgical History:  Procedure Laterality Date  . INNER EAR SURGERY     for hearing loss. L ear.  . TONSILECTOMY, ADENOIDECTOMY, BILATERAL MYRINGOTOMY AND TUBES    .  TUBAL LIGATION      FAMILY HISTORY: Family History  Problem Relation Age of Onset  . Diabetes Father   . Hyperlipidemia Father   . Cancer Maternal Aunt   . Cancer Paternal Grandmother     ADVANCED DIRECTIVES (Y/N):  N  HEALTH MAINTENANCE: Social History   Tobacco Use  . Smoking status: Former Research scientist (life sciences)  . Smokeless tobacco: Never Used  Substance Use Topics  . Alcohol use: No  . Drug use: No     Colonoscopy:  PAP:  Bone density:  Lipid panel:  No Known Allergies  Current Outpatient Medications  Medication Sig Dispense Refill  . levothyroxine (SYNTHROID, LEVOTHROID) 50 MCG tablet Take 1 tablet (50 mcg total) by mouth daily before breakfast. 90 tablet 3  . Multiple Vitamins-Minerals (VISION FORMULA EYE HEALTH PO) Take 4 tablets by mouth daily.    Marland Kitchen loratadine (ALLERGY RELIEF) 10 MG tablet Take 10 mg by mouth daily.     No current facility-administered medications for this visit.    Facility-Administered Medications Ordered in Other Visits  Medication Dose Route Frequency Provider Last Rate Last Dose  . sodium chloride flush (NS) 0.9 % injection 10 mL  10 mL Intravenous PRN Lloyd Huger, MD   10 mL at 05/20/17 1118    OBJECTIVE: Vitals:   05/20/17 1024  BP: 116/79  Pulse: 97  Resp: 20  Temp: 97.9 F (36.6 C)     Body mass index is 20.86 kg/m.    ECOG FS:0 - Asymptomatic  General: Well-developed, well-nourished, no acute distress. Eyes: Pink conjunctiva, anicteric sclera. HEENT: Normocephalic, moist mucous membranes,  clear oropharnyx. Lungs: Clear to auscultation bilaterally. Heart: Regular rate and rhythm. No rubs, murmurs, or gallops. Abdomen: Soft, nontender, nondistended. No organomegaly noted, normoactive bowel sounds. Musculoskeletal: No edema, cyanosis, or clubbing. Neuro: Alert, answering all questions appropriately. Cranial nerves grossly intact. Skin: No rashes or petechiae noted. Psych: Normal affect.  LAB RESULTS:  Lab Results    Component Value Date   NA 142 07/16/2016   K 5.1 07/16/2016   CL 98 07/16/2016   CO2 28 07/16/2016   GLUCOSE 77 07/16/2016   BUN 13 07/16/2016   CREATININE 0.72 05/16/2017   CALCIUM 9.8 07/16/2016   PROT 7.3 07/16/2016   ALBUMIN 4.6 07/16/2016   AST 29 07/16/2016   ALT 20 07/16/2016   ALKPHOS 117 07/16/2016   BILITOT 0.5 07/16/2016   GFRNONAA >60 05/16/2017   GFRAA >60 05/16/2017    Lab Results  Component Value Date   WBC 5.9 07/16/2016   HGB 14.1 07/16/2016   HCT 42.7 07/16/2016   MCV 87 07/16/2016   PLT 223 07/16/2016     STUDIES: Ct Head W Wo Contrast  Result Date: 05/16/2017 CLINICAL DATA:  Headaches.  Primary lung cancer. EXAM: CT HEAD WITHOUT AND WITH CONTRAST TECHNIQUE: Contiguous axial images were obtained from the base of the skull through the vertex without and with intravenous contrast CONTRAST:  25mL ISOVUE-300 IOPAMIDOL (ISOVUE-300) INJECTION 61% COMPARISON:  None. FINDINGS: Brain: No evidence for acute infarction, hemorrhage, mass lesion, hydrocephalus, or extra-axial fluid. Normal for age cerebral volume. Hypoattenuation of white matter consistent with small vessel disease. Post infusion, no abnormal enhancement of the brain or meninges. Post infusion, no abnormal intracranial enhancement. Vascular: Calcification of the cavernous internal carotid arteries consistent with cerebrovascular atherosclerotic disease. No signs of intracranial large vessel occlusion. Skull: No definite osseous destructive lesions.  No fracture. Sinuses/Orbits: Mild fluid in the smaller RIGHT division sphenoid, non worrisome. No significant opacity in the remaining paranasal sinuses. Negative orbits. RIGHT middle turbinate concha bullosa. Other: None. IMPRESSION: Unremarkable CT head without and with contrast. No evidence for intracranial metastatic disease. Electronically Signed   By: Staci Righter M.D.   On: 05/16/2017 14:29   Ct Chest W Contrast  Result Date: 05/16/2017 CLINICAL DATA:   Stage IIIB squamous cell carcinoma of the right lung. EXAM: CT CHEST WITH CONTRAST TECHNIQUE: Multidetector CT imaging of the chest was performed during intravenous contrast administration. CONTRAST:  33mL ISOVUE-300 IOPAMIDOL (ISOVUE-300) INJECTION 61% COMPARISON:  11/15/2016 FINDINGS: Cardiovascular: Heart size upper normal. No pericardial effusion. Atherosclerotic calcification is noted in the wall of the thoracic aorta. Mediastinum/Nodes: Fluid again noted in superior pericardial recess. 9 mm short axis AP window lymph node measured on the previous study is 7 mm short axis today. Small lymph nodes in the right hilum are similar. No left hilar lymphadenopathy. The esophagus has normal imaging features. There is no axillary lymphadenopathy. Lungs/Pleura: Volume loss in the right lung is similar. Post treatment scarring appears similar. 5 x 4 cm cavitary process posterior right lung base is not substantially changed when measured at the same level. Tiny peripheral nodules in the left lung are stable. No left pleural effusion. Upper Abdomen: Calcified gallstones. Stable 9 mm lesion in the right hepatic lobe (image 109/3). Borderline to mild lymphadenopathy in the hepato duodenal ligament, incompletely visualized, but similar to prior. Musculoskeletal: Bone windows reveal no worrisome lytic or sclerotic osseous lesions. IMPRESSION: 1. Similar exam without definite new or progressive disease. 2. Volume loss right hemithorax with post treatment scarring and similar appearance  of cavitation in the posterior right lower hemithorax. 3. Stable indeterminate lesion right liver. 4. Upper normal to borderline lymphadenopathy in the hepato duodenal ligament is incompletely visualized but appears unchanged. 5. Cholelithiasis 6.  Aortic Atherosclerois (ICD10-170.0) Electronically Signed   By: Misty Stanley M.D.   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 Stage IIIB  squamous cell cancer of right lung: Patient completed concurrent XRT along with weekly carboplatinum and Taxol on October 02, 2014. She then received 2 cycles of consolidation treatment completing on November 15, 2014. She had a bronchoscopy performed on March 02, 2016 which did not reveal any signs of recurrent or progressive disease.  Her most recent CT scan on May 16, 2017 reviewed independently and reported as above with no definitive new or progressive disease.  No intervention is needed at this time.  Return to clinic in 6 months with repeat imaging and further evaluation.  If her CT scan remains negative at that point, she can be switched to yearly visits. 2.  Port: We briefly discussed removing her port today, the patient wishes to have it remain in until she is 3 years out from completing her treatments.  Continue port flush every 6 weeks. 3.  Thyroid: Have ordered a TSH with thyroid panel today.  Will forward results to patient's new primary care physician.  Approximately 30 minutes was spent in discussion of which greater than 50% consultation.  Patient expressed understanding and was in agreement with this plan. She also understands that She can call clinic at any time with any questions, concerns, or complaints.   Cancer Staging Squamous cell lung cancer [facility]) Staging form: Lung, AJCC 8th Edition - Clinical stage from 07/30/2016: Stage IIIB (cT4, cN2, cM0) - Signed by Lloyd Huger, MD on 07/30/2016   Lloyd Huger, MD   05/20/2017 1:45 PM

## 2017-05-20 ENCOUNTER — Inpatient Hospital Stay (HOSPITAL_BASED_OUTPATIENT_CLINIC_OR_DEPARTMENT_OTHER): Payer: Medicare Other | Admitting: Oncology

## 2017-05-20 ENCOUNTER — Encounter: Payer: Self-pay | Admitting: Oncology

## 2017-05-20 ENCOUNTER — Inpatient Hospital Stay: Payer: Medicare Other

## 2017-05-20 ENCOUNTER — Other Ambulatory Visit: Payer: Self-pay

## 2017-05-20 VITALS — BP 116/79 | HR 97 | Temp 97.9°F | Resp 20 | Ht 64.0 in | Wt 121.5 lb

## 2017-05-20 DIAGNOSIS — Z923 Personal history of irradiation: Secondary | ICD-10-CM

## 2017-05-20 DIAGNOSIS — Z87891 Personal history of nicotine dependence: Secondary | ICD-10-CM | POA: Diagnosis not present

## 2017-05-20 DIAGNOSIS — L989 Disorder of the skin and subcutaneous tissue, unspecified: Secondary | ICD-10-CM

## 2017-05-20 DIAGNOSIS — C3491 Malignant neoplasm of unspecified part of right bronchus or lung: Secondary | ICD-10-CM

## 2017-05-20 DIAGNOSIS — E079 Disorder of thyroid, unspecified: Secondary | ICD-10-CM | POA: Diagnosis not present

## 2017-05-20 DIAGNOSIS — Z85118 Personal history of other malignant neoplasm of bronchus and lung: Secondary | ICD-10-CM | POA: Diagnosis not present

## 2017-05-20 DIAGNOSIS — Z85828 Personal history of other malignant neoplasm of skin: Secondary | ICD-10-CM

## 2017-05-20 DIAGNOSIS — Z9221 Personal history of antineoplastic chemotherapy: Secondary | ICD-10-CM | POA: Diagnosis not present

## 2017-05-20 DIAGNOSIS — Z95828 Presence of other vascular implants and grafts: Secondary | ICD-10-CM

## 2017-05-20 DIAGNOSIS — E039 Hypothyroidism, unspecified: Secondary | ICD-10-CM

## 2017-05-20 MED ORDER — SODIUM CHLORIDE 0.9% FLUSH
10.0000 mL | INTRAVENOUS | Status: DC | PRN
  Administered 2017-05-20: 10 mL via INTRAVENOUS
  Filled 2017-05-20: qty 10

## 2017-05-20 MED ORDER — HEPARIN SOD (PORK) LOCK FLUSH 100 UNIT/ML IV SOLN
500.0000 [IU] | Freq: Once | INTRAVENOUS | Status: AC
  Administered 2017-05-20: 500 [IU] via INTRAVENOUS

## 2017-05-20 NOTE — Progress Notes (Signed)
Patient recently had a squamous cell ca removed above her lip approx. 2 months ago. Patient has noticed a new lesion/sore on her right leg (anterior) and below her knee. She noted redness at this sight. She did shave this leg this morning and accidentally scraped the area with her razer blade. There is a palpable knot in this location. Patient denies any tenderness to this site. Pt denies any trauma. Pt reports that she noticed this lesion on the leg 2 weeks ago. She has not been reevaluated by dermatology for this leg lesion.  Patient also reports that her tsh has not been checked in over 6 months. She is concerned about her current sythroid dosing and would like to have a TSH drawn today if possible. She states that her pcp is moving and will not no longer be in practice locally. She has not been able to establish an apt with a new pcp. She was also inquiring if a cholesterol panel could also be performed. I explained to the patient that I would let Dr. Grayland Ormond know her request. However, she still need to establish with a new pcp for routine health maintenance.

## 2017-05-21 LAB — THYROID PANEL WITH TSH
Free Thyroxine Index: 2.5 (ref 1.2–4.9)
T3 Uptake Ratio: 26 % (ref 24–39)
T4 TOTAL: 9.6 ug/dL (ref 4.5–12.0)
TSH: 3.35 u[IU]/mL (ref 0.450–4.500)

## 2017-07-01 ENCOUNTER — Inpatient Hospital Stay: Payer: Medicare Other | Attending: Oncology

## 2017-07-01 ENCOUNTER — Ambulatory Visit (INDEPENDENT_AMBULATORY_CARE_PROVIDER_SITE_OTHER): Payer: Medicare Other | Admitting: Internal Medicine

## 2017-07-01 ENCOUNTER — Encounter: Payer: Self-pay | Admitting: Internal Medicine

## 2017-07-01 VITALS — BP 115/73 | HR 91 | Resp 20

## 2017-07-01 VITALS — BP 96/70 | HR 103 | Temp 97.6°F | Resp 16 | Ht 64.0 in | Wt 125.0 lb

## 2017-07-01 DIAGNOSIS — I700 Atherosclerosis of aorta: Secondary | ICD-10-CM | POA: Diagnosis not present

## 2017-07-01 DIAGNOSIS — J322 Chronic ethmoidal sinusitis: Secondary | ICD-10-CM

## 2017-07-01 DIAGNOSIS — Z85828 Personal history of other malignant neoplasm of skin: Secondary | ICD-10-CM | POA: Insufficient documentation

## 2017-07-01 DIAGNOSIS — Z1231 Encounter for screening mammogram for malignant neoplasm of breast: Secondary | ICD-10-CM | POA: Diagnosis not present

## 2017-07-01 DIAGNOSIS — Z85118 Personal history of other malignant neoplasm of bronchus and lung: Secondary | ICD-10-CM | POA: Diagnosis not present

## 2017-07-01 DIAGNOSIS — Z452 Encounter for adjustment and management of vascular access device: Secondary | ICD-10-CM | POA: Diagnosis present

## 2017-07-01 DIAGNOSIS — E039 Hypothyroidism, unspecified: Secondary | ICD-10-CM | POA: Diagnosis not present

## 2017-07-01 DIAGNOSIS — Z95828 Presence of other vascular implants and grafts: Secondary | ICD-10-CM

## 2017-07-01 MED ORDER — PREDNISONE 10 MG PO TABS: ORAL_TABLET | ORAL | 0 refills | Status: DC

## 2017-07-01 MED ORDER — HEPARIN SOD (PORK) LOCK FLUSH 100 UNIT/ML IV SOLN
500.0000 [IU] | Freq: Once | INTRAVENOUS | Status: AC
  Administered 2017-07-01: 500 [IU] via INTRAVENOUS

## 2017-07-01 MED ORDER — SODIUM CHLORIDE 0.9% FLUSH
10.0000 mL | INTRAVENOUS | Status: DC | PRN
  Administered 2017-07-01: 10 mL via INTRAVENOUS
  Filled 2017-07-01: qty 10

## 2017-07-01 NOTE — Progress Notes (Signed)
Date:  07/01/2017   Name:  Kimberly Burch   DOB:  07/24/1940   MRN:  [id number]   Chief Complaint: Thyroid Problem and Dizziness Thyroid Problem  Presents for follow-up visit. Patient reports no anxiety, constipation, fatigue, leg swelling, palpitations or weight gain. The symptoms have been stable.  Dizziness  This is a new problem. The current episode started 1 to 4 weeks ago. The problem has been waxing and waning. Pertinent negatives include no chest pain, chills, fatigue, fever or headaches.  Recent CT brain showed some fluid in the ethmoid sinus.  No brain mass or other abnormality.   Review of Systems  Constitutional: Negative for chills, fatigue, fever and weight gain.  HENT: Positive for hearing loss. Negative for dental problem.   Eyes: Negative for visual disturbance.  Respiratory: Negative for chest tightness, shortness of breath and wheezing.   Cardiovascular: Negative for chest pain and palpitations.  Gastrointestinal: Negative for constipation.  Neurological: Positive for dizziness. Negative for light-headedness and headaches.  Psychiatric/Behavioral: Negative for dysphoric mood and sleep disturbance. The patient is not nervous/anxious.     Patient Active Problem List   Diagnosis Date Noted  . Allergic rhinitis 01/26/2017  . Nocturnal leg cramps 08/16/2016  . History of lung cancer 07/16/2016  . Hypothyroidism 07/16/2016  . Gait instability 07/16/2016  . Vitamin D deficiency 07/16/2016  . Osteoarthritis 07/16/2016    Prior to Admission medications   Medication Sig Start Date End Date Taking? Authorizing Provider  levothyroxine (SYNTHROID, LEVOTHROID) 50 MCG tablet Take 1 tablet (50 mcg total) by mouth daily before breakfast. 11/15/16  Yes Plonk, Gwyndolyn Saxon, MD  loratadine (ALLERGY RELIEF) 10 MG tablet Take 10 mg by mouth daily.   Yes [provider]  Multiple Vitamins-Minerals (VISION FORMULA EYE HEALTH PO) Take 4 tablets by mouth daily.   Yes [provider]    No Known Allergies  Past Surgical History:  Procedure Laterality Date  . INNER EAR SURGERY     for hearing loss. L ear.  . TONSILECTOMY, ADENOIDECTOMY, BILATERAL MYRINGOTOMY AND TUBES    . TUBAL LIGATION      Social History   Tobacco Use  . Smoking status: Former Research scientist (life sciences)  . Smokeless tobacco: Never Used  Substance Use Topics  . Alcohol use: No  . Drug use: No     Medication list has been reviewed and updated.  Current Meds  Medication Sig  . levothyroxine (SYNTHROID, LEVOTHROID) 50 MCG tablet Take 1 tablet (50 mcg total) by mouth daily before breakfast.  . loratadine (ALLERGY RELIEF) 10 MG tablet Take 10 mg by mouth daily.  . Multiple Vitamins-Minerals (VISION FORMULA EYE HEALTH PO) Take 4 tablets by mouth daily.    PHQ 2/9 Scores 08/16/2016  PHQ - 2 Score 0    Physical Exam  Constitutional: She is oriented to person, place, and time. She appears well-developed. No distress.  HENT:  Head: Normocephalic and atraumatic.  Eyes: Pupils are equal, round, and reactive to light. Conjunctivae and EOM are normal.  Neck: Normal range of motion. Neck supple.  Cardiovascular: Normal rate, regular rhythm and normal heart sounds.  Pulmonary/Chest: Effort normal. No respiratory distress.  Musculoskeletal: Normal range of motion.  Lymphadenopathy:    She has no cervical adenopathy.  Neurological: She is alert and oriented to person, place, and time.  Skin: Skin is warm and dry. No rash noted.  Psychiatric: She has a normal mood and affect. Her behavior is normal. Thought content normal.  Nursing  note and vitals reviewed.   BP 96/70 (BP Location: Right Arm, Cuff Size: Normal)   Pulse (!) 103   Temp 97.6 F (36.4 C) (Oral)   Resp 16   Ht 5\' 4"  (1.626 m)   Wt 125 lb (56.7 kg)   SpO2 97%   BMI 21.46 kg/m   Assessment and Plan: 1. Chronic ethmoidal sinusitis May be causing current mild symptoms If no improvement after prednisone, will refer to ENT - CBC  with Differential/Platelet - predniSONE (DELTASONE) 10 MG tablet; Take 6 on day 1, 5 on day 2, 4 on day 3, 3 on day 4, 2 on day 5 and 1 on day 1 then stop.  Dispense: 21 tablet; Refill: 0  2. Hypothyroidism, unspecified type Recent labs normal Continue current dose  3. History of lung cancer Doing well, in remission Followed by Oncology - Comprehensive metabolic panel  4. Encounter for screening mammogram for breast cancer - MM DIGITAL SCREENING BILATERAL; Future  5. Atherosclerosis of aorta ([place]) Noted on CT of chest Will check labs and continue to monitor BP  Meds ordered this encounter  Medications  . predniSONE (DELTASONE) 10 MG tablet    Sig: Take 6 on day 1, 5 on day 2, 4 on day 3, 3 on day 4, 2 on day 5 and 1 on day 1 then stop.    Dispense:  21 tablet    Refill:  0    Partially dictated using Editor, commissioning. Any errors are unintentional.  Halina Maidens, MD [facility]  07/01/2017   There are no diagnoses linked to this encounter.

## 2017-07-02 LAB — COMPREHENSIVE METABOLIC PANEL
ALT: 19 IU/L (ref 0–32)
AST: 26 IU/L (ref 0–40)
Albumin/Globulin Ratio: 1.5 (ref 1.2–2.2)
Albumin: 4.3 g/dL (ref 3.5–4.8)
Alkaline Phosphatase: 113 IU/L (ref 39–117)
BILIRUBIN TOTAL: 0.5 mg/dL (ref 0.0–1.2)
BUN/Creatinine Ratio: 17 (ref 12–28)
BUN: 12 mg/dL (ref 8–27)
CHLORIDE: 101 mmol/L (ref 96–106)
CO2: 27 mmol/L (ref 20–29)
Calcium: 9.9 mg/dL (ref 8.7–10.3)
Creatinine, Ser: 0.72 mg/dL (ref 0.57–1.00)
GFR calc Af Amer: 93 mL/min/{1.73_m2} (ref >59)
GFR calc non Af Amer: 81 mL/min/{1.73_m2} (ref >59)
Globulin, Total: 2.9 g/dL (ref 1.5–4.5)
Glucose: 80 mg/dL (ref 65–99)
Potassium: 4.9 mmol/L (ref 3.5–5.2)
Sodium: 141 mmol/L (ref 134–144)
Total Protein: 7.2 g/dL (ref 6.0–8.5)

## 2017-07-02 LAB — CBC WITH DIFFERENTIAL/PLATELET
BASOS ABS: 0 10*3/uL (ref 0.0–0.2)
Basos: 0 %
EOS (ABSOLUTE): 0.1 10*3/uL (ref 0.0–0.4)
Eos: 2 %
Hematocrit: 42 % (ref 34.0–46.6)
Hemoglobin: 13.6 g/dL (ref 11.1–15.9)
IMMATURE GRANS (ABS): 0 10*3/uL (ref 0.0–0.1)
IMMATURE GRANULOCYTES: 0 %
LYMPHS: 21 %
Lymphocytes Absolute: 1.4 10*3/uL (ref 0.7–3.1)
MCH: 27.8 pg (ref 26.6–33.0)
MCHC: 32.4 g/dL (ref 31.5–35.7)
MCV: 86 fL (ref 79–97)
Monocytes Absolute: 0.7 10*3/uL (ref 0.1–0.9)
Monocytes: 11 %
NEUTROS ABS: 4.4 10*3/uL (ref 1.4–7.0)
NEUTROS PCT: 66 %
PLATELETS: 279 10*3/uL (ref 150–450)
RBC: 4.9 x10E6/uL (ref 3.77–5.28)
RDW: 13.9 % (ref 12.3–15.4)
WBC: 6.8 10*3/uL (ref 3.4–10.8)

## 2017-07-13 ENCOUNTER — Inpatient Hospital Stay: Admission: RE | Admit: 2017-07-13 | Payer: TRICARE For Life (TFL) | Source: Ambulatory Visit

## 2017-08-19 ENCOUNTER — Inpatient Hospital Stay: Payer: Medicare Other | Attending: Oncology

## 2017-08-19 VITALS — BP 125/77 | HR 90 | Temp 96.3°F | Resp 18

## 2017-08-19 DIAGNOSIS — Z85118 Personal history of other malignant neoplasm of bronchus and lung: Secondary | ICD-10-CM | POA: Insufficient documentation

## 2017-08-19 DIAGNOSIS — Z452 Encounter for adjustment and management of vascular access device: Secondary | ICD-10-CM | POA: Insufficient documentation

## 2017-08-19 DIAGNOSIS — Z85828 Personal history of other malignant neoplasm of skin: Secondary | ICD-10-CM | POA: Insufficient documentation

## 2017-08-19 DIAGNOSIS — Z95828 Presence of other vascular implants and grafts: Secondary | ICD-10-CM

## 2017-08-19 MED ORDER — SODIUM CHLORIDE 0.9% FLUSH
10.0000 mL | INTRAVENOUS | Status: DC | PRN
  Administered 2017-08-19: 10 mL via INTRAVENOUS
  Filled 2017-08-19: qty 10

## 2017-08-19 MED ORDER — HEPARIN SOD (PORK) LOCK FLUSH 100 UNIT/ML IV SOLN
500.0000 [IU] | Freq: Once | INTRAVENOUS | Status: AC
  Administered 2017-08-19: 500 [IU] via INTRAVENOUS

## 2017-09-14 ENCOUNTER — Ambulatory Visit (INDEPENDENT_AMBULATORY_CARE_PROVIDER_SITE_OTHER): Payer: Medicare Other | Admitting: Internal Medicine

## 2017-09-14 ENCOUNTER — Encounter: Payer: Self-pay | Admitting: Internal Medicine

## 2017-09-14 VITALS — BP 118/72 | HR 104 | Ht 64.0 in | Wt 122.0 lb

## 2017-09-14 DIAGNOSIS — J449 Chronic obstructive pulmonary disease, unspecified: Secondary | ICD-10-CM

## 2017-09-14 MED ORDER — FLUTICASONE-SALMETEROL 115-21 MCG/ACT IN AERO: 2.0000 | INHALATION_SPRAY | Freq: Two times a day (BID) | RESPIRATORY_TRACT | 12 refills | Status: DC

## 2017-09-14 MED ORDER — TIOTROPIUM BROMIDE MONOHYDRATE 1.25 MCG/ACT IN AERS: 2.0000 | INHALATION_SPRAY | Freq: Two times a day (BID) | RESPIRATORY_TRACT | 5 refills | Status: DC

## 2017-09-14 NOTE — Patient Instructions (Signed)
Start ADVAIR HFA START SPIRIVA RESPIMAT  Check 6MWT and ONO

## 2017-09-14 NOTE — Progress Notes (Signed)
Name: Kimberly Burch MRN: [id number] DOB: November 05, 1940     CONSULTATION DATE: 09/14/2017 REFERRING MD : Grayland Ormond  CHIEF COMPLAINT: Chronic productive cough  STUDIES:     4.15.19 CT chest Independently reviewed by Me Volume loss in the right lung Post treatment scarring  5 x 4 cm cavitary process posterior right lung base is not substantially changed when measured at the same level. Tiny peripheral nodules in the left lung are stable. No left pleural effusion.  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 She then received 2 cycles of consolidation treatment completing on November 15, 2014. She had a bronchoscopy performed on March 02, 2016 which did not reveal any signs of recurrent or progressive disease.  Her most recent CT scan on May 16, 2017 reviewed independently and reported as above with no definitive new or progressive disease.  No intervention is needed at this time.     HISTORY OF PRESENT ILLNESS: 77 year old pleasant white female seen today for abnormal CT chest findings Patient has a stage III squamous cell carcinoma of the lung diagnosed in 2016 status post radiation therapy  CT of the chest in April 2019 reviewed with patient There is progressive volume loss of the right lung including a right lower lobe cavitary lesion that has been stable over the last several CT scans  I have explained to patient that her right lung is nonfunctional  Patient states that she has progressive dyspnea on exertion of the last several months has a chronic productive cough She has no fevers at this time  Patient was treated with antibiotics and steroids in April of this year for pneumonia and COPD exacerbation  Patient has a history of extensive smoking history approximately 2 packs a day for 40 years but she quit 20 years ago It seems at her dyspnea on exertion has  worsened over the last several months She has adapted to her worsening breathing  She has intermittent wheezing She is currently not on any inhaled medications  No signs of infection at this time No signs of heart failure at this time   Based on previous oncology notes her lung cancer is in remission    PAST MEDICAL HISTORY :   has a past medical history of Cancer ([place]) (07/2014) and Thyroid disease.  has a past surgical history that includes Tubal ligation; Inner ear surgery; and Tonsilectomy, adenoidectomy, bilateral myringotomy and tubes. Prior to Admission medications   Medication Sig Start Date End Date Taking? Authorizing Provider  levothyroxine (SYNTHROID, LEVOTHROID) 50 MCG tablet Take 1 tablet (50 mcg total) by mouth daily before breakfast. 11/15/16  Yes Plonk, Gwyndolyn Saxon, MD  loratadine (ALLERGY RELIEF) 10 MG tablet Take 10 mg by mouth daily.   Yes [provider]  Multiple Vitamins-Minerals (VISION FORMULA EYE HEALTH PO) Take 4 tablets by mouth daily.   Yes [provider]   No Known Allergies  FAMILY HISTORY:  family history includes Cancer in her maternal aunt and paternal grandmother; Diabetes in her father; Hyperlipidemia in her father. SOCIAL HISTORY:  reports that she has quit smoking. She has never used smokeless tobacco. She reports that she does not drink alcohol or use drugs.  REVIEW OF SYSTEMS:   Constitutional: Negative for fever, chills, weight loss, malaise/fatigue and diaphoresis.  HENT: Negative for hearing loss, ear pain, nosebleeds, congestion, sore throat, neck pain, tinnitus and ear discharge.   Eyes: Negative  for blurred vision, double vision, photophobia, pain, discharge and redness.  Respiratory: +sputum production, +shortness of breath, +wheezing and stridor.   Cardiovascular: Negative for chest pain, palpitations, orthopnea, claudication, leg swelling and PND.  Gastrointestinal: Negative for heartburn, nausea, vomiting, abdominal  pain, diarrhea, constipation, blood in stool and melena.  Genitourinary: Negative for dysuria, urgency, frequency, hematuria and flank pain.  Musculoskeletal: Negative for myalgias, back pain, joint pain and falls.  Skin: Negative for itching and rash.  Neurological: Negative for dizziness, tingling, tremors, sensory change, speech change, focal weakness, seizures, loss of consciousness, weakness and headaches.  Endo/Heme/Allergies: Negative for environmental allergies and polydipsia. Does not bruise/bleed easily.  ALL OTHER ROS ARE NEGATIVE    BP 118/72 (BP Location: Left Arm, Cuff Size: Normal)   Pulse (!) 104   Ht 5\' 4"  (1.626 m)   Wt 122 lb (55.3 kg)   SpO2 98%   BMI 20.94 kg/m   Physical Examination:   GENERAL:NAD, no fevers, chills, no weakness no fatigue HEAD: Normocephalic, atraumatic.  EYES: Pupils equal, round, reactive to light. Extraocular muscles intact. No scleral icterus.  MOUTH: Moist mucosal membrane.   EAR, NOSE, THROAT: Clear without exudates. No external lesions.  NECK: Supple. No thyromegaly. No nodules. No JVD.  PULMONARY:RT lung + wheezes,+ rhonchi CARDIOVASCULAR: S1 and S2. Regular rate and rhythm. No murmurs, rubs, or gallops. No edema.  GASTROINTESTINAL: Soft, nontender, nondistended. No masses. Positive bowel sounds.  MUSCULOSKELETAL: No swelling, clubbing, or edema. Range of motion full in all extremities.  NEUROLOGIC: Cranial nerves II through XII are intact. No gross focal neurological deficits.  SKIN: No ulceration, lesions, rashes, or cyanosis. Skin warm and dry. Turgor intact.  PSYCHIATRIC: Mood, affect within normal limits. The patient is awake, alert and oriented x 3. Insight, judgment intact.      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 All questions answered Follow-up when tests are completed   Corrin Parker, M.D.  [facility]  Medical Director [place] Director [place] Department

## 2017-09-20 ENCOUNTER — Ambulatory Visit: Payer: Medicare Other | Admitting: Internal Medicine

## 2017-09-23 ENCOUNTER — Telehealth: Payer: Self-pay | Admitting: *Deleted

## 2017-09-23 ENCOUNTER — Ambulatory Visit (INDEPENDENT_AMBULATORY_CARE_PROVIDER_SITE_OTHER): Payer: Medicare Other | Admitting: *Deleted

## 2017-09-23 DIAGNOSIS — Z85118 Personal history of other malignant neoplasm of bronchus and lung: Secondary | ICD-10-CM

## 2017-09-23 NOTE — Telephone Encounter (Signed)
Pt was seen on 8/14 and started on Advair and Spiriva. Pt in office to SMW today but want DK to know she still has cough with yellow mucus. She states it looks like orange juice. She says that there was mention of abx if inhaler therapy did not improve.

## 2017-09-23 NOTE — Patient Instructions (Signed)
Follow up previously scheduled. 

## 2017-09-23 NOTE — Progress Notes (Signed)
SIX MIN WALK 09/23/2017  Medications Advair HFA 115-21,Levothyroxine 50 mcg/Loratadine 10 mg/Spiriva Resp 1.25/MVI  Supplimental Oxygen during Test? (L/min) No  Laps 7  Partial Lap (in Meters) 36  Baseline BP (sitting) 106/80  Baseline Heartrate 102  Baseline Dyspnea (Borg Scale) 0.5  Baseline Fatigue (Borg Scale) 0  Baseline SPO2 98  BP (sitting) 120/60  Heartrate 112  Dyspnea (Borg Scale) 3  Fatigue (Borg Scale) 3  SPO2 96  BP (sitting) 112/64  Heartrate 104  SPO2 99  Stopped or Paused before Six Minutes No  Distance Completed 372  Tech Comments: Pt walked at a brisk walk without any complaint. She did not de-sat.     SMW performed today.

## 2017-09-26 MED ORDER — AZITHROMYCIN 250 MG PO TABS: ORAL_TABLET | ORAL | 0 refills | Status: AC

## 2017-09-26 NOTE — Telephone Encounter (Signed)
Pt aware  rx sent Nothing further needed.

## 2017-09-26 NOTE — Telephone Encounter (Signed)
z pak please

## 2017-09-28 ENCOUNTER — Encounter: Payer: Self-pay | Admitting: Internal Medicine

## 2017-10-04 ENCOUNTER — Other Ambulatory Visit: Payer: Self-pay | Admitting: *Deleted

## 2017-10-04 ENCOUNTER — Telehealth: Payer: Self-pay | Admitting: Internal Medicine

## 2017-10-04 ENCOUNTER — Other Ambulatory Visit: Payer: Self-pay

## 2017-10-04 DIAGNOSIS — J449 Chronic obstructive pulmonary disease, unspecified: Secondary | ICD-10-CM

## 2017-10-04 MED ORDER — TIOTROPIUM BROMIDE MONOHYDRATE 1.25 MCG/ACT IN AERS: 2.0000 | INHALATION_SPRAY | Freq: Two times a day (BID) | RESPIRATORY_TRACT | 5 refills | Status: DC

## 2017-10-04 MED ORDER — FLUTICASONE-SALMETEROL 115-21 MCG/ACT IN AERO: 2.0000 | INHALATION_SPRAY | Freq: Two times a day (BID) | RESPIRATORY_TRACT | 12 refills | Status: DC

## 2017-10-04 NOTE — Telephone Encounter (Signed)
Pt does not have 02. SMW was done to rule out need. Patient needs inhalers refilled.( [facility]) Done.

## 2017-10-04 NOTE — Telephone Encounter (Signed)
Pt wanted to let us know that the company has still not picked up her oxygen monitoring system. Please call and discuss.

## 2017-10-04 NOTE — Telephone Encounter (Signed)
yes

## 2017-10-04 NOTE — Telephone Encounter (Signed)
Patient did not desat during SMW. Is it ok to place order to D/C oxygen?

## 2017-10-05 ENCOUNTER — Other Ambulatory Visit: Payer: Self-pay | Admitting: *Deleted

## 2017-10-05 ENCOUNTER — Other Ambulatory Visit: Payer: Self-pay

## 2017-10-05 DIAGNOSIS — J449 Chronic obstructive pulmonary disease, unspecified: Secondary | ICD-10-CM

## 2017-10-05 MED ORDER — FLUTICASONE-SALMETEROL 115-21 MCG/ACT IN AERO: 2.0000 | INHALATION_SPRAY | Freq: Two times a day (BID) | RESPIRATORY_TRACT | 2 refills | Status: AC

## 2017-10-05 MED ORDER — TIOTROPIUM BROMIDE MONOHYDRATE 1.25 MCG/ACT IN AERS: 2.0000 | INHALATION_SPRAY | Freq: Two times a day (BID) | RESPIRATORY_TRACT | 3 refills | Status: DC

## 2017-10-05 MED ORDER — LEVOTHYROXINE SODIUM 50 MCG PO TABS: 50.0000 ug | ORAL_TABLET | Freq: Every day | ORAL | 2 refills | Status: AC

## 2017-10-05 NOTE — Progress Notes (Signed)
rx refills submitted per patient request to [facility].

## 2017-10-05 NOTE — Addendum Note (Signed)
Addended by: Stephanie Coup on: 10/05/2017 11:21 AM   Modules accepted: Orders

## 2017-10-06 ENCOUNTER — Other Ambulatory Visit: Payer: Self-pay | Admitting: *Deleted

## 2017-10-06 DIAGNOSIS — J449 Chronic obstructive pulmonary disease, unspecified: Secondary | ICD-10-CM

## 2017-10-06 MED ORDER — TIOTROPIUM BROMIDE MONOHYDRATE 1.25 MCG/ACT IN AERS: 2.0000 | INHALATION_SPRAY | Freq: Every day | RESPIRATORY_TRACT | 3 refills | Status: AC

## 2017-10-07 ENCOUNTER — Telehealth: Payer: Self-pay | Admitting: *Deleted

## 2017-10-07 NOTE — Telephone Encounter (Signed)
LMOM for pt to return call to inform her that her ONO results are negative and she doesn't need O2.

## 2017-10-07 NOTE — Telephone Encounter (Signed)
Pt informed of results.

## 2017-10-07 NOTE — Telephone Encounter (Signed)
Patient returning call.

## 2017-10-10 ENCOUNTER — Ambulatory Visit (INDEPENDENT_AMBULATORY_CARE_PROVIDER_SITE_OTHER): Payer: Medicare Other | Admitting: Internal Medicine

## 2017-10-10 ENCOUNTER — Encounter: Payer: Self-pay | Admitting: Internal Medicine

## 2017-10-10 ENCOUNTER — Ambulatory Visit (INDEPENDENT_AMBULATORY_CARE_PROVIDER_SITE_OTHER): Payer: Medicare Other

## 2017-10-10 VITALS — BP 104/68 | HR 102 | Temp 97.6°F | Ht 64.0 in | Wt 120.0 lb

## 2017-10-10 VITALS — BP 100/60 | HR 97 | Temp 98.0°F | Resp 12 | Ht 64.0 in | Wt 120.0 lb

## 2017-10-10 DIAGNOSIS — J189 Pneumonia, unspecified organism: Secondary | ICD-10-CM

## 2017-10-10 DIAGNOSIS — F39 Unspecified mood [affective] disorder: Secondary | ICD-10-CM

## 2017-10-10 DIAGNOSIS — Z23 Encounter for immunization: Secondary | ICD-10-CM

## 2017-10-10 DIAGNOSIS — E2839 Other primary ovarian failure: Secondary | ICD-10-CM

## 2017-10-10 DIAGNOSIS — Z0000 Encounter for general adult medical examination without abnormal findings: Secondary | ICD-10-CM

## 2017-10-10 MED ORDER — DOXYCYCLINE HYCLATE 100 MG PO TABS: 100.0000 mg | ORAL_TABLET | Freq: Two times a day (BID) | ORAL | 0 refills | Status: AC

## 2017-10-10 NOTE — Progress Notes (Signed)
Subjective:   Kimberly Burch is a 77 y.o. female who presents for an Initial Medicare Annual Wellness Visit.  Review of Systems    N/A  Cardiac Risk Factors include: advanced age (>26men, >51 women);sedentary lifestyle     Objective:    Today's Vitals   10/10/17 0938  BP: 100/60  Pulse: 97  Resp: 12  Temp: 98 F (36.7 C)  TempSrc: Oral  SpO2: 96%  Weight: 120 lb (54.4 kg)  Height: 5\' 4"  (1.626 m)   Body mass index is 20.6 kg/m.  Advanced Directives 10/10/2017 05/20/2017 11/19/2016 09/20/2016 08/16/2016  Does Patient Have a Medical Advance Directive? Yes No Yes No Yes  Type of Paramedic of [place];Living will - [place];Living will - -  Copy of [place] in Chart? No - copy requested - - - -  Would patient like information on creating a medical advance directive? - No - Patient declined - - -    Current Medications (verified) Outpatient Encounter Medications as of 10/10/2017  Medication Sig  . fluticasone-salmeterol (ADVAIR HFA) 115-21 MCG/ACT inhaler Inhale 2 puffs into the lungs 2 (two) times daily.  Marland Kitchen levothyroxine (SYNTHROID, LEVOTHROID) 50 MCG tablet Take 1 tablet (50 mcg total) by mouth daily before breakfast.  . loratadine (ALLERGY RELIEF) 10 MG tablet Take 10 mg by mouth daily.  . Multiple Vitamins-Minerals (VISION FORMULA EYE HEALTH PO) Take 4 tablets by mouth daily.  . Tiotropium Bromide Monohydrate (SPIRIVA RESPIMAT) 1.25 MCG/ACT AERS Inhale 2 puffs into the lungs daily.   No facility-administered encounter medications on file as of 10/10/2017.     Allergies (verified) Patient has no known allergies.   History: Past Medical History:  Diagnosis Date  . Cancer ([place]) 07/2014   lung cancer- couldn't do surgery. Radiation and chemo.   . Thyroid disease    Past Surgical History:  Procedure Laterality Date  . INNER EAR SURGERY     for hearing loss. L ear.  . TONSILECTOMY, ADENOIDECTOMY, BILATERAL  MYRINGOTOMY AND TUBES    . TUBAL LIGATION     Family History  Problem Relation Age of Onset  . Diabetes Father   . Hyperlipidemia Father   . Cancer Maternal Aunt   . Cancer Paternal Grandmother   . Heart disease Mother    Social History   Socioeconomic History  . Marital status: Married    Spouse name: Not on file  . Number of children: 2  . Years of education: some college  . Highest education level: 12th grade  Occupational History  . Occupation: Retired  Scientific laboratory technician  . Financial resource strain: Not hard at all  . Food insecurity:    Worry: Never true    Inability: Never true  . Transportation needs:    Medical: No    Non-medical: No  Tobacco Use  . Smoking status: Former Smoker    Packs/day: 2.00    Years: 30.00    Pack years: 60.00    Types: Cigarettes    Last attempt to quit: 1995    Years since quitting: 24.7  . Smokeless tobacco: Never Used  . Tobacco comment: smoking cessation materials not required  Substance and Sexual Activity  . Alcohol use: No  . Drug use: No  . Sexual activity: Not Currently  Lifestyle  . Physical activity:    Days per week: 0 days    Minutes per session: 0 min  . Stress: Not at all  Relationships  .  Social connections:    Talks on phone: Patient refused    Gets together: Patient refused    Attends religious service: Patient refused    Active member of club or organization: Patient refused    Attends meetings of clubs or organizations: Patient refused    Relationship status: Married  Other Topics Concern  . Not on file  Social History Narrative  . Not on file    Tobacco Counseling Counseling given: No Comment: smoking cessation materials not required  Clinical Intake:  Pre-visit preparation completed: Yes  Pain : No/denies pain   BMI - recorded: 20.6 Nutritional Status: BMI of 19-24  Normal Nutritional Risks: Unintentional weight loss(States she no longer has an appetite. Was seen by pulmonologist for f/u lung  cancer. Also developed a productive cough. Afebrile. Started ATB's but has not had any relief) Diabetes: No  How often do you need to have someone help you when you read instructions, pamphlets, or other written materials from your doctor or pharmacy?: 1 - Never  Interpreter Needed?: No  Information entered by :: AEversole, LPN   Activities of Daily Living In your present state of health, do you have any difficulty performing the following activities: 10/10/2017  Hearing? N  Comment R hearing aid  Vision? N  Comment wears eyeglasses  Difficulty concentrating or making decisions? N  Walking or climbing stairs? Y  Comment dyspnea  Dressing or bathing? N  Doing errands, shopping? N  Preparing Food and eating ? N  Comment full set upper and lower dentures  Using the Toilet? N  In the past six months, have you accidently leaked urine? Y  Comment stress incontinence  Do you have problems with loss of bowel control? N  Managing your Medications? N  Managing your Finances? N  Housekeeping or managing your Housekeeping? N  Some recent data might be hidden     Immunizations and Health Maintenance Immunization History  Administered Date(s) Administered  . Influenza, High Dose Seasonal PF 10/10/2017  . Influenza,inj,Quad PF,6+ Mos 11/10/2016  . Pneumococcal Conjugate-13 10/10/2017  . Zoster Recombinat (Shingrix) 05/03/2017   Health Maintenance Due  Topic Date Due  . DEXA SCAN  03/05/2005  . MAMMOGRAM  06/11/2017    Patient Care Team: Glean Hess, MD as PCP - General (Internal Medicine) Lloyd Huger, MD as Consulting Physician (Oncology) Margaretha Sheffield, MD as Consulting Physician (Otolaryngology) Jannet Mantis, MD as Consulting Physician (Dermatology) Flora Lipps, MD as Consulting Physician (Pulmonary Disease)  Indicate any recent Medical Services you may have received from other than [facility] providers in the past year (date may be approximate).       Assessment:   This is a routine wellness examination for [name].  Hearing/Vision screen Vision Screening Comments: Sees Dr. Wyatt Burch for annual eye exams  Dietary issues and exercise activities discussed: Current Exercise Habits: The patient does not participate in regular exercise at present, Exercise limited by: None identified  Goals    . DIET - INCREASE WATER INTAKE     Recommend to drink at least 6-8 8oz glasses of water per day.      Depression Screen PHQ 2/9 Scores 10/10/2017 08/16/2016  PHQ - 2 Score 6 0  PHQ- 9 Score 14 -    Fall Risk Fall Risk  10/10/2017 08/16/2016  Falls in the past year? No No  Risk for fall due to : Impaired vision;Medication side effect -  Risk for fall due to: Comment wears eyeglasses -    FALL  RISK PREVENTION PERTAINING TO HOME: Is your home free of loose throw rugs in walkways, pet beds, electrical cords, etc? Yes Is there adequate lighting in your home to reduce risk of falls?  Yes Are there stairs in or around your home WITH handrails? Yes  ASSISTIVE DEVICES UTILIZED TO PREVENT FALLS: Use of a cane, walker or w/c? No Grab bars in the bathroom? Yes  Shower chair or a place to sit while bathing? Yes An elevated toilet seat or a handicapped toilet? Yes  Timed Get Up and Go Performed: Yes. Pt ambulated 10 feet within 8 sec. Gait stead-fast and without the use of an assistive device. No intervention required at this time. Fall risk prevention has been discussed.  Community Resource Referral:  [place] Referral not required at this time.   Cognitive Function:     6CIT Screen 10/10/2017  What Year? 0 points  What month? 0 points  What time? 0 points  Count back from 20 0 points  Months in reverse 0 points  Repeat phrase 4 points  Total Score 4    Screening Tests Health Maintenance  Topic Date Due  . DEXA SCAN  03/05/2005  . MAMMOGRAM  06/11/2017  . TETANUS/TDAP  10/11/2018 (Originally 03/06/1959)  . PNA vac Low Risk Adult (2 of  2 - PPSV23) 10/11/2018  . INFLUENZA VACCINE  Completed    Qualifies for Shingles Vaccine? Yes. Completed first Shingrix dose on 05/03/17. Due for 2nd does of Shingrix. Education has been provided regarding the importance of this vaccine. Pt has been advised to call insurance company to determine out of pocket expense. Advised may also receive vaccine at local pharmacy or Health Dept. Verbalized acceptance and understanding.  Due for Tdap vaccine. Education has been provided regarding the importance of this vaccine. Advised may receive this vaccine at local pharmacy or Health Dept. Aware to provide a copy of the vaccination record if obtained from local pharmacy or Health Dept. Verbalized acceptance and understanding.  Cancer Screenings: Lung: Low Dose CT Chest recommended if Age 60-80 years, 30 pack-year currently smoking OR have quit w/in 15years. Patient does not qualify. Followed by pulmonologist and oncology for lung cancer Breast: Up to date on Mammogram? No. Ordered 07/01/17. Not yet completed. Message sent to referral coordinator for scheduling purposes   Up to date of Bone Density/Dexa? No. Ordered today. Message sent to referral coordinator for scheduling purposes. Colorectal: No longer required  Additional Screenings: Hepatitis C Screening: Does not qualify   Plan:  I have personally reviewed and addressed the Medicare Annual Wellness questionnaire and have noted the following in the patient's chart:  A. Medical and social history B. Use of alcohol, tobacco or illicit drugs  C. Current medications and supplements D. Functional ability and status E.  Nutritional status F.  Physical activity G. Advance directives H. List of other physicians I.  Hospitalizations, surgeries, and ER visits in previous 12 months J.  [place] such as hearing and vision if needed, cognitive and depression L. Referrals and appointments  In addition, I have reviewed and discussed with patient  certain preventive protocols, quality metrics, and best practice recommendations. A written personalized care plan for preventive services as well as general preventive health recommendations were provided to patient.  Signed,  Aleatha Borer, LPN Nurse Health Advisor  MD Recommendations: Completed first Shingrix dose on 05/03/17. Due for 2nd does of Shingrix. Education has been provided regarding the importance of this vaccine. Pt has been advised to call  insurance company to determine out of pocket expense. Advised may also receive vaccine at local pharmacy or Health Dept. Verbalized acceptance and understanding.  Due for Tdap vaccine. Education has been provided regarding the importance of this vaccine. Advised may receive this vaccine at local pharmacy or Health Dept. Aware to provide a copy of the vaccination record if obtained from local pharmacy or Health Dept. Verbalized acceptance and understanding.  Mammogram: Ordered 07/01/17. Not yet completed. Message sent to referral coordinator for scheduling purposes    Bone Density/Dexa: Ordered today. Message sent to referral coordinator for scheduling purposes.

## 2017-10-10 NOTE — Patient Instructions (Signed)
Kimberly Burch , Thank you for taking time to come for your Medicare Wellness Visit. I appreciate your ongoing commitment to your health goals. Please review the following plan we discussed and let me know if I can assist you in the future.   Screening recommendations/referrals: Colorectal Screening: No longer required Mammogram: Please call to schedule your appointment Bone Density: Please call to schedule your appointment  Vision and Dental Exams: Recommended annual ophthalmology exams for early detection of glaucoma and other disorders of the eye Recommended annual dental exams for proper oral hygiene  Vaccinations: Influenza vaccine: Completed today Pneumococcal vaccine: Completed today Tdap vaccine: Declined. Please call your insurance company to determine your out of pocket expense. You may also receive this vaccine at your local pharmacy or Health Dept. Shingles vaccine: Please call your insurance company to determine your out of pocket expense for the Shingrix vaccine. You may also receive this vaccine at your local pharmacy or Health Dept.    Advanced directives: Please bring a copy of your POA (Power of Attorney) and/or Living Will to your next appointment.  Goals: Recommend to drink at least 6-8 8oz glasses of water per day.  Next appointment: Please schedule your Annual Wellness Visit with your Nurse Health Advisor in one year.  Preventive Care 77 Years and Older, Female Preventive care refers to lifestyle choices and visits with your health care provider that can promote health and wellness. What does preventive care include?  A yearly physical exam. This is also called an annual well check.  Dental exams once or twice a year.  Routine eye exams. Ask your health care provider how often you should have your eyes checked.  Personal lifestyle choices, including:  Daily care of your teeth and gums.  Regular physical activity.  Eating a healthy diet.  Avoiding tobacco  and drug use.  Limiting alcohol use.  Practicing safe sex.  Taking low-dose aspirin every day.  Taking vitamin and mineral supplements as recommended by your health care provider. What happens during an annual well check? The services and screenings done by your health care provider during your annual well check will depend on your age, overall health, lifestyle risk factors, and family history of disease. Counseling  Your health care provider may ask you questions about your:  Alcohol use.  Tobacco use.  Drug use.  Emotional well-being.  Home and relationship well-being.  Sexual activity.  Eating habits.  History of falls.  Memory and ability to understand (cognition).  Work and work Statistician.  Reproductive health. Screening  You may have the following tests or measurements:  Height, weight, and BMI.  Blood pressure.  Lipid and cholesterol levels. These may be checked every 5 years, or more frequently if you are over 75 years old.  Skin check.  Lung cancer screening. You may have this screening every year starting at age 58 if you have a 30-pack-year history of smoking and currently smoke or have quit within the past 15 years.  Fecal occult blood test (FOBT) of the stool. You may have this test every year starting at age 45.  Flexible sigmoidoscopy or colonoscopy. You may have a sigmoidoscopy every 5 years or a colonoscopy every 10 years starting at age 57.  Hepatitis C blood test.  Hepatitis B blood test.  Sexually transmitted disease (STD) testing.  Diabetes screening. This is done by checking your blood sugar (glucose) after you have not eaten for a while (fasting). You may have this done every 1-3 years.  Bone density scan. This is done to screen for osteoporosis. You may have this done starting at age 22.  Mammogram. This may be done every 1-2 years. Talk to your health care provider about how often you should have regular mammograms. Talk with  your health care provider about your test results, treatment options, and if necessary, the need for more tests. Vaccines  Your health care provider may recommend certain vaccines, such as:  Influenza vaccine. This is recommended every year.  Tetanus, diphtheria, and acellular pertussis (Tdap, Td) vaccine. You may need a Td booster every 10 years.  Zoster vaccine. You may need this after age 2.  Pneumococcal 13-valent conjugate (PCV13) vaccine. One dose is recommended after age 31.  Pneumococcal polysaccharide (PPSV23) vaccine. One dose is recommended after age 19. Talk to your health care provider about which screenings and vaccines you need and how often you need them. This information is not intended to replace advice given to you by your health care provider. Make sure you discuss any questions you have with your health care provider. Document Released: 02/14/2015 Document Revised: 10/08/2015 Document Reviewed: 11/19/2014 Elsevier Interactive Patient Education  2017 [place] Prevention in the Home Falls can cause injuries. They can happen to people of all ages. There are many things you can do to make your home safe and to help prevent falls. What can I do on the outside of my home?  Regularly fix the edges of walkways and driveways and fix any cracks.  Remove anything that might make you trip as you walk through a door, such as a raised step or threshold.  Trim any bushes or trees on the path to your home.  Use bright outdoor lighting.  Clear any walking paths of anything that might make someone trip, such as rocks or tools.  Regularly check to see if handrails are loose or broken. Make sure that both sides of any steps have handrails.  Any raised decks and porches should have guardrails on the edges.  Have any leaves, snow, or ice cleared regularly.  Use sand or salt on walking paths during winter.  Clean up any spills in your garage right away. This  includes oil or grease spills. What can I do in the bathroom?  Use night lights.  Install grab bars by the toilet and in the tub and shower. Do not use towel bars as grab bars.  Use non-skid mats or decals in the tub or shower.  If you need to sit down in the shower, use a plastic, non-slip stool.  Keep the floor dry. Clean up any water that spills on the floor as soon as it happens.  Remove soap buildup in the tub or shower regularly.  Attach bath mats securely with double-sided non-slip rug tape.  Do not have throw rugs and other things on the floor that can make you trip. What can I do in the bedroom?  Use night lights.  Make sure that you have a light by your bed that is easy to reach.  Do not use any sheets or blankets that are too big for your bed. They should not hang down onto the floor.  Have a firm chair that has side arms. You can use this for support while you get dressed.  Do not have throw rugs and other things on the floor that can make you trip. What can I do in the kitchen?  Clean up any spills right away.  Avoid walking  on wet floors.  Keep items that you use a lot in easy-to-reach places.  If you need to reach something above you, use a strong step stool that has a grab bar.  Keep electrical cords out of the way.  Do not use floor polish or wax that makes floors slippery. If you must use wax, use non-skid floor wax.  Do not have throw rugs and other things on the floor that can make you trip. What can I do with my stairs?  Do not leave any items on the stairs.  Make sure that there are handrails on both sides of the stairs and use them. Fix handrails that are broken or loose. Make sure that handrails are as long as the stairways.  Check any carpeting to make sure that it is firmly attached to the stairs. Fix any carpet that is loose or worn.  Avoid having throw rugs at the top or bottom of the stairs. If you do have throw rugs, attach them to the  floor with carpet tape.  Make sure that you have a light switch at the top of the stairs and the bottom of the stairs. If you do not have them, ask someone to add them for you. What else can I do to help prevent falls?  Wear shoes that:  Do not have high heels.  Have rubber bottoms.  Are comfortable and fit you well.  Are closed at the toe. Do not wear sandals.  If you use a stepladder:  Make sure that it is fully opened. Do not climb a closed stepladder.  Make sure that both sides of the stepladder are locked into place.  Ask someone to hold it for you, if possible.  Clearly mark and make sure that you can see:  Any grab bars or handrails.  First and last steps.  Where the edge of each step is.  Use tools that help you move around (mobility aids) if they are needed. These include:  Canes.  Walkers.  Scooters.  Crutches.  Turn on the lights when you go into a dark area. Replace any light bulbs as soon as they burn out.  Set up your furniture so you have a clear path. Avoid moving your furniture around.  If any of your floors are uneven, fix them.  If there are any pets around you, be aware of where they are.  Review your medicines with your doctor. Some medicines can make you feel dizzy. This can increase your chance of falling. Ask your doctor what other things that you can do to help prevent falls. This information is not intended to replace advice given to you by your health care provider. Make sure you discuss any questions you have with your health care provider. Document Released: 11/14/2008 Document Revised: 06/26/2015 Document Reviewed: 02/22/2014 Elsevier Interactive Patient Education  2017 [employer].

## 2017-10-10 NOTE — Progress Notes (Signed)
Date:  10/10/2017   Name:  SHANZAY HEPWORTH   DOB:  [birth year]   MRN:  [id number]   Chief Complaint: Cough (x 1 month. Coughing with yellow mucous. Fatigue.) and Depression (PHQ9- 14. Crying all the time and can't control it. Son is living in her home and causing more stress. "I want to run away from everything."   )  Cough  This is a chronic problem. The current episode started more than 1 month ago. The problem has been unchanged. The problem occurs every few hours. The cough is productive of sputum. Associated symptoms include shortness of breath and weight loss. Pertinent negatives include no chest pain, chills, fever, headaches, postnasal drip, sweats or wheezing. The symptoms are aggravated by exercise. She has tried steroid inhaler and a beta-agonist inhaler for the symptoms. The treatment provided mild relief. There is no history of environmental allergies. hx lung cancer  Depression         This is a new problem.  The onset quality is undetermined.   Associated symptoms include fatigue, irritable and appetite change.  Associated symptoms include no headaches, not sad and no suicidal ideas.     The symptoms are aggravated by family issues (chronically ill husband and a son who lives with her and is causing stress).  Past treatments include nothing (she does not want medication at this time - she believes that if her chest infection is treated and she feels well physically that she can deal with this.).  (hx lung cancer)   Review of Systems  Constitutional: Positive for appetite change, fatigue and weight loss. Negative for chills, diaphoresis and fever.  HENT: Negative for postnasal drip.   Respiratory: Positive for cough, chest tightness and shortness of breath. Negative for wheezing.   Cardiovascular: Negative for chest pain and palpitations.  Gastrointestinal: Negative for blood in stool, diarrhea and nausea.  Allergic/Immunologic: Negative for environmental allergies.  Neurological:  Negative for dizziness, light-headedness and headaches.  Psychiatric/Behavioral: Positive for depression and dysphoric mood. Negative for agitation, behavioral problems and suicidal ideas. The patient is nervous/anxious.     Patient Active Problem List   Diagnosis Date Noted  . Atherosclerosis of aorta ([place]) 07/01/2017  . Allergic rhinitis 01/26/2017  . Nocturnal leg cramps 08/16/2016  . History of lung cancer 07/16/2016  . Hypothyroidism 07/16/2016  . Gait instability 07/16/2016  . Vitamin D deficiency 07/16/2016  . Osteoarthritis 07/16/2016    No Known Allergies  Past Surgical History:  Procedure Laterality Date  . INNER EAR SURGERY     for hearing loss. L ear.  . TONSILECTOMY, ADENOIDECTOMY, BILATERAL MYRINGOTOMY AND TUBES    . TUBAL LIGATION      Social History   Tobacco Use  . Smoking status: Former Smoker    Packs/day: 2.00    Years: 30.00    Pack years: 60.00    Types: Cigarettes    Last attempt to quit: 1995    Years since quitting: 24.7  . Smokeless tobacco: Never Used  . Tobacco comment: smoking cessation materials not required  Substance Use Topics  . Alcohol use: No  . Drug use: No     Medication list has been reviewed and updated.  Current Meds  Medication Sig  . fluticasone-salmeterol (ADVAIR HFA) 115-21 MCG/ACT inhaler Inhale 2 puffs into the lungs 2 (two) times daily.  Marland Kitchen levothyroxine (SYNTHROID, LEVOTHROID) 50 MCG tablet Take 1 tablet (50 mcg total) by mouth daily before breakfast.  . loratadine (ALLERGY RELIEF) 10  MG tablet Take 10 mg by mouth daily.  . Multiple Vitamins-Minerals (VISION FORMULA EYE HEALTH PO) Take 4 tablets by mouth daily.  . Tiotropium Bromide Monohydrate (SPIRIVA RESPIMAT) 1.25 MCG/ACT AERS Inhale 2 puffs into the lungs daily.    PHQ 2/9 Scores 10/10/2017 10/10/2017 08/16/2016  PHQ - 2 Score 6 6 0  PHQ- 9 Score 14 14 -    Physical Exam  Constitutional: She appears well-developed and well-nourished. She is irritable.  Neck:  Normal range of motion. Neck supple.  Cardiovascular: Normal rate, regular rhythm and normal heart sounds.  Pulmonary/Chest: Effort normal. No respiratory distress. She has decreased breath sounds in the right middle field and the right lower field. She has no wheezes.  Lymphadenopathy:    She has no cervical adenopathy.  Psychiatric: Her speech is normal. Judgment normal. Her mood appears anxious. Cognition and memory are normal. She exhibits a depressed mood. She expresses no suicidal ideation. She expresses no suicidal plans.    BP 104/68 (BP Location: Right Arm, Patient Position: Sitting, Cuff Size: Normal)   Pulse (!) 102   Temp 97.6 F (36.4 C) (Oral)   Ht 5\' 4"  (1.626 m)   Wt 120 lb (54.4 kg)   SpO2 100%   BMI 20.60 kg/m   Assessment and Plan: 1. Community acquired pneumonia, unspecified laterality Continue inhalers, fluids, mucinex - doxycycline (VIBRA-TABS) 100 MG tablet; Take 1 tablet (100 mg total) by mouth 2 (two) times daily for 10 days.  Dispense: 20 tablet; Refill: 0  2. Mood disorder ([place]) No treatment for now per pt request Will follow up after antibiotics if needed   Meds ordered this encounter  Medications  . doxycycline (VIBRA-TABS) 100 MG tablet    Sig: Take 1 tablet (100 mg total) by mouth 2 (two) times daily for 10 days.    Dispense:  20 tablet    Refill:  0    Partially dictated using Editor, commissioning. Any errors are unintentional.  Halina Maidens, MD [facility]  10/10/2017

## 2017-10-14 ENCOUNTER — Inpatient Hospital Stay: Payer: Medicare Other | Attending: Oncology

## 2017-10-14 DIAGNOSIS — Z95828 Presence of other vascular implants and grafts: Secondary | ICD-10-CM

## 2017-10-14 DIAGNOSIS — Z85828 Personal history of other malignant neoplasm of skin: Secondary | ICD-10-CM | POA: Insufficient documentation

## 2017-10-14 DIAGNOSIS — Z452 Encounter for adjustment and management of vascular access device: Secondary | ICD-10-CM | POA: Diagnosis not present

## 2017-10-14 DIAGNOSIS — Z85118 Personal history of other malignant neoplasm of bronchus and lung: Secondary | ICD-10-CM | POA: Insufficient documentation

## 2017-10-14 MED ORDER — SODIUM CHLORIDE 0.9% FLUSH
10.0000 mL | Freq: Once | INTRAVENOUS | Status: AC
  Administered 2017-10-14: 10 mL via INTRAVENOUS
  Filled 2017-10-14: qty 10

## 2017-10-14 MED ORDER — HEPARIN SOD (PORK) LOCK FLUSH 100 UNIT/ML IV SOLN
500.0000 [IU] | Freq: Once | INTRAVENOUS | Status: AC
  Administered 2017-10-14: 500 [IU] via INTRAVENOUS

## 2017-10-24 ENCOUNTER — Ambulatory Visit
Admission: RE | Admit: 2017-10-24 | Discharge: 2017-10-24 | Disposition: A | Discharge status: Home / Self Care | Payer: Medicare Other | Source: Ambulatory Visit | Attending: Internal Medicine | Admitting: Internal Medicine

## 2017-10-24 DIAGNOSIS — Z1231 Encounter for screening mammogram for malignant neoplasm of breast: Secondary | ICD-10-CM | POA: Insufficient documentation

## 2017-10-24 DIAGNOSIS — E2839 Other primary ovarian failure: Secondary | ICD-10-CM

## 2017-10-24 HISTORY — DX: Personal history of irradiation: Z92.3

## 2017-10-24 HISTORY — DX: Personal history of antineoplastic chemotherapy: Z92.21

## 2017-10-24 HISTORY — DX: Unspecified malignant neoplasm of skin, unspecified: C44.90

## 2017-10-31 ENCOUNTER — Inpatient Hospital Stay
Admission: RE | Admit: 2017-10-31 | Discharge: 2017-10-31 | Disposition: A | Discharge status: Home / Self Care | Payer: Self-pay | Source: Ambulatory Visit | Attending: *Deleted | Admitting: *Deleted

## 2017-10-31 ENCOUNTER — Other Ambulatory Visit: Payer: Self-pay | Admitting: *Deleted

## 2017-10-31 DIAGNOSIS — Z9289 Personal history of other medical treatment: Secondary | ICD-10-CM

## 2017-11-04 ENCOUNTER — Other Ambulatory Visit: Payer: Self-pay | Admitting: *Deleted

## 2017-11-04 DIAGNOSIS — C3490 Malignant neoplasm of unspecified part of unspecified bronchus or lung: Secondary | ICD-10-CM

## 2017-11-11 ENCOUNTER — Ambulatory Visit
Admission: RE | Admit: 2017-11-11 | Discharge: 2017-11-11 | Disposition: A | Discharge status: Home / Self Care | Payer: Medicare Other | Source: Ambulatory Visit | Attending: Oncology | Admitting: Oncology

## 2017-11-11 ENCOUNTER — Inpatient Hospital Stay: Payer: Medicare Other | Attending: Oncology

## 2017-11-11 ENCOUNTER — Encounter (INDEPENDENT_AMBULATORY_CARE_PROVIDER_SITE_OTHER): Payer: Self-pay

## 2017-11-11 DIAGNOSIS — Z9221 Personal history of antineoplastic chemotherapy: Secondary | ICD-10-CM | POA: Diagnosis not present

## 2017-11-11 DIAGNOSIS — J860 Pyothorax with fistula: Secondary | ICD-10-CM | POA: Diagnosis not present

## 2017-11-11 DIAGNOSIS — C3491 Malignant neoplasm of unspecified part of right bronchus or lung: Secondary | ICD-10-CM | POA: Diagnosis not present

## 2017-11-11 DIAGNOSIS — Z800 Family history of malignant neoplasm of digestive organs: Secondary | ICD-10-CM | POA: Insufficient documentation

## 2017-11-11 DIAGNOSIS — Z923 Personal history of irradiation: Secondary | ICD-10-CM | POA: Insufficient documentation

## 2017-11-11 DIAGNOSIS — K8020 Calculus of gallbladder without cholecystitis without obstruction: Secondary | ICD-10-CM | POA: Insufficient documentation

## 2017-11-11 DIAGNOSIS — Z87891 Personal history of nicotine dependence: Secondary | ICD-10-CM | POA: Diagnosis not present

## 2017-11-11 DIAGNOSIS — Z931 Gastrostomy status: Secondary | ICD-10-CM | POA: Insufficient documentation

## 2017-11-11 DIAGNOSIS — E079 Disorder of thyroid, unspecified: Secondary | ICD-10-CM | POA: Diagnosis not present

## 2017-11-11 DIAGNOSIS — D7389 Other diseases of spleen: Secondary | ICD-10-CM | POA: Insufficient documentation

## 2017-11-11 DIAGNOSIS — Z85118 Personal history of other malignant neoplasm of bronchus and lung: Secondary | ICD-10-CM | POA: Diagnosis present

## 2017-11-11 DIAGNOSIS — C3490 Malignant neoplasm of unspecified part of unspecified bronchus or lung: Secondary | ICD-10-CM

## 2017-11-11 LAB — CREATININE, SERUM
CREATININE: 0.73 mg/dL (ref 0.44–1.00)
GFR calc non Af Amer: >60 mL/min (ref >60)

## 2017-11-11 MED ORDER — IOHEXOL 300 MG/ML  SOLN
75.0000 mL | Freq: Once | INTRAMUSCULAR | Status: AC | PRN
  Administered 2017-11-11: 75 mL via INTRAVENOUS

## 2017-11-12 ENCOUNTER — Ambulatory Visit
Admission: EM | Admit: 2017-11-12 | Discharge: 2017-11-12 | Disposition: A | Discharge status: Home / Self Care | Payer: Medicare Other | Attending: Family Medicine | Admitting: Family Medicine

## 2017-11-12 DIAGNOSIS — J40 Bronchitis, not specified as acute or chronic: Secondary | ICD-10-CM | POA: Diagnosis not present

## 2017-11-12 DIAGNOSIS — Z87891 Personal history of nicotine dependence: Secondary | ICD-10-CM | POA: Diagnosis not present

## 2017-11-12 DIAGNOSIS — R05 Cough: Secondary | ICD-10-CM

## 2017-11-12 DIAGNOSIS — R059 Cough, unspecified: Secondary | ICD-10-CM

## 2017-11-12 MED ORDER — PREDNISONE 20 MG PO TABS: 40.0000 mg | ORAL_TABLET | Freq: Every day | ORAL | 0 refills | Status: DC

## 2017-11-12 MED ORDER — DOXYCYCLINE HYCLATE 100 MG PO CAPS: 100.0000 mg | ORAL_CAPSULE | Freq: Two times a day (BID) | ORAL | 0 refills | Status: DC

## 2017-11-12 NOTE — ED Provider Notes (Signed)
[facility] ____________________________________________  Time seen: Approximately 3:21 PM  I have reviewed the triage vital signs and the nursing notes.   HISTORY  Chief Complaint URI   HPI Kimberly Burch is a 77 y.o. female presenting with friend at bedside for evaluation of cough and chest congestion present for the last 1 month.  States she was seen by her primary care and was initially treated with antibiotic, which she reports really did help her symptoms but continued with mild cough.  States that the cough has since been productive again of greenish phlegm.  States has been eating a lot of cough drops to help, and subsequently has some greenish stool.  Denies hemoptysis or blood in stool.  No associated abdominal pain.  States cough comes and goes.  Does occasionally hears herself wheeze, no wheezing today.  Denies chest pain or shortness of breath.  Denies abdominal pain.  Denies any extremity pain or extremity swelling.  Continues to remain active.  Drinking fluids well, not as much appetite.  Has follow-up with her oncologist next week as she had a follow-up CT chest performed yesterday.  Has inhalers at home that she has continued to use as prescribed by her pulmonologist.  Denies any runny nose, nasal congestion or sinus pressure.  No fevers.  Denies other aggravating alleviating factors.  Reports otherwise doing well.   Glean Hess, MD: PCP   Past Medical History:  Diagnosis Date  . Cancer ([place]) 07/2014   lung cancer- couldn't do surgery. Radiation and chemo.   . Personal history of chemotherapy   . Personal history of radiation therapy   . Skin cancer   . Thyroid disease     Patient Active Problem List   Diagnosis Date Noted  . Atherosclerosis of aorta ([place]) 07/01/2017  . Allergic rhinitis 01/26/2017  . Nocturnal leg cramps 08/16/2016  . History of lung cancer 07/16/2016  . Hypothyroidism 07/16/2016  . Gait instability 07/16/2016  . Vitamin D  deficiency 07/16/2016  . Osteoarthritis 07/16/2016    Past Surgical History:  Procedure Laterality Date  . INNER EAR SURGERY     for hearing loss. L ear.  . TONSILECTOMY, ADENOIDECTOMY, BILATERAL MYRINGOTOMY AND TUBES    . TUBAL LIGATION       No current facility-administered medications for this encounter.   Current Outpatient Medications:  .  fluticasone-salmeterol (ADVAIR HFA) 115-21 MCG/ACT inhaler, Inhale 2 puffs into the lungs 2 (two) times daily., Disp: 36 g, Rfl: 2 .  levothyroxine (SYNTHROID, LEVOTHROID) 50 MCG tablet, Take 1 tablet (50 mcg total) by mouth daily before breakfast., Disp: 90 tablet, Rfl: 2 .  loratadine (ALLERGY RELIEF) 10 MG tablet, Take 10 mg by mouth daily., Disp: , Rfl:  .  Tiotropium Bromide Monohydrate (SPIRIVA RESPIMAT) 1.25 MCG/ACT AERS, Inhale 2 puffs into the lungs daily., Disp: 12 g, Rfl: 3 .  doxycycline (VIBRAMYCIN) 100 MG capsule, Take 1 capsule (100 mg total) by mouth 2 (two) times daily., Disp: 20 capsule, Rfl: 0 .  Multiple Vitamins-Minerals (VISION FORMULA EYE HEALTH PO), Take 4 tablets by mouth daily., Disp: , Rfl:  .  predniSONE (DELTASONE) 20 MG tablet, Take 2 tablets (40 mg total) by mouth daily., Disp: 10 tablet, Rfl: 0  Allergies Patient has no known allergies.  Family History  Problem Relation Age of Onset  . Diabetes Father   . Hyperlipidemia Father   . Cancer Maternal Aunt   . Cancer Paternal Grandmother   . Breast cancer Paternal Grandmother 22  .  Heart disease Mother     Social History Social History   Tobacco Use  . Smoking status: Former Smoker    Packs/day: 2.00    Years: 30.00    Pack years: 60.00    Types: Cigarettes    Last attempt to quit: 1995    Years since quitting: 24.7  . Smokeless tobacco: Never Used  . Tobacco comment: smoking cessation materials not required  Substance Use Topics  . Alcohol use: No  . Drug use: No    Review of Systems Constitutional: No fever/chills Cardiovascular: Denies  chest pain. Respiratory: Denies shortness of breath. As above.  Gastrointestinal: No abdominal pain.  No nausea, no vomiting.  No diarrhea.   Genitourinary: Negative for dysuria. Musculoskeletal: Negative for back pain. Skin: Negative for rash.  ____________________________________________   PHYSICAL EXAM:  VITAL SIGNS: ED Triage Vitals  Enc Vitals Group     BP 11/12/17 1434 102/66     Pulse Rate 11/12/17 1434 (!) 109     Resp 11/12/17 1434 18     Temp 11/12/17 1434 97.6 F (36.4 C)     Temp Source 11/12/17 1434 Oral     SpO2 11/12/17 1434 100 %     Weight 11/12/17 1438 120 lb (54.4 kg)     Height --      Head Circumference --      Peak Flow --      Pain Score 11/12/17 1438 3     Pain Loc --      Pain Edu? --      Excl. in [place]? --     Constitutional: Alert and oriented. Well appearing and in no acute distress. Eyes: Conjunctivae are normal.  Head: Atraumatic.No sinus tenderness to palpation. No swelling. No erythema.  Ears: no erythema, normal TMs bilaterally.   Nose:Mild nasal congestion.  Mouth/Throat: Mucous membranes are moist. No pharyngeal erythema. No tonsillar swelling or exudate.  Neck: No stridor.  No cervical spine tenderness to palpation. Hematological/Lymphatic/Immunilogical: No cervical lymphadenopathy. Cardiovascular: Normal rate, regular rhythm. Grossly normal heart sounds.  Good peripheral circulation. Respiratory: Normal respiratory effort. No retractions. No wheezes, rales or rhonchi.  Dry intermittent cough noted in room. Musculoskeletal: Ambulatory with steady gait.  No lower extremity edema noted bilaterally.  Neurologic:  Normal speech and language. No gait instability. Skin:  Skin appears warm, dry and intact. No rash noted. Psychiatric: Mood and affect are normal. Speech and behavior are normal.  ___________________________________________   LABS (all labs ordered are listed, but only abnormal results are displayed)  Labs Reviewed - No data  to display ____________________________________________  RADIOLOGY  No results found.   CLINICAL DATA:  Squamous cell carcinoma.  EXAM: CT CHEST WITH CONTRAST  TECHNIQUE: Multidetector CT imaging of the chest was performed during intravenous contrast administration.  CONTRAST:  47mL OMNIPAQUE IOHEXOL 300 MG/ML  SOLN  COMPARISON:  05/16/2017  FINDINGS: Cardiovascular: Heart is enlarged. Coronary artery calcification is evident. Atherosclerotic calcification is noted in the wall of the thoracic aorta. Left Port-A-Cath tip is in the proximal SVC.  Mediastinum/Nodes: Similar appearance of scattered small mediastinal lymph nodes. The 7 mm short axis AP window lymph node measured previously is stable at 7 mm. Clustered know if nodes in the high right paratracheal space are slightly more prominent than before with 1 of the more dominant nodes measuring 7 mm short axis (23/2). There is no axillary lymphadenopathy. The esophagus has normal imaging features.  Lungs/Pleura: Volume loss in the right hemithorax is stable. The  central tracheobronchial airways are patent. Similar appearance of the bronchiectasis and consolidative change in the right mid and lower lung including the dominant cavitary process in the posterior right costophrenic sulcus. No new suspicious pulmonary nodule or mass in the left lung. No left pleural effusion.  Upper Abdomen: Gallstones again noted. 8 mm low-density posterior right liver lesion is similar to prior. Interval development of cystic change in the splenic hilum measuring 1.5 x 2.2 cm is indeterminate.  Musculoskeletal: No worrisome lytic or sclerotic osseous abnormality.  IMPRESSION: 1. Similar volume loss loss right hemithorax with stable appearance of presumed scarring/consolidation in the posterior right lower chest. 2. Mild progression of nonenlarged lymph nodes in the high right paratracheal region. Attention on follow-up. 3.  Interval development of a 2.2 cm low-density lesion in the splenic hilum, indeterminate. Close attention on follow-up recommended. 4. No change indeterminate 8 mm posterior right liver lesion. 5. Cholelithiasis.   Electronically Signed   By: Misty Stanley M.D.   On: 11/11/2017 14:49 ____________________________________________   PROCEDURES Procedures     INITIAL IMPRESSION / ASSESSMENT AND PLAN / ED COURSE  Pertinent labs & imaging results that were available during my care of the patient were reviewed by me and considered in my medical decision making (see chart for details).  Patient well-appearing.  Patient outpatient CT reviewed from yesterday, results as above.  As per radiologist results appear stable some progression of lymph nodes and recommend close attention on follow-up, no pleural effusion and no new suspicious pulmonary nodule or mass in the left lung per radiologist.  Suspect bronchitis.  Will treat patient with oral doxycycline and prednisone, continue home albuterol.  Strongly encouraged very close follow-up with primary care and pulmonologist this week.  Discussed strict follow-up and return parameters.Discussed indication, risks and benefits of medications with patient. Discussed follow up and return parameters including no resolution or any worsening concerns. Patient verbalized understanding and agreed to plan.   ____________________________________________   FINAL CLINICAL IMPRESSION(S) / ED DIAGNOSES  Final diagnoses:  Cough  Bronchitis     ED Discharge Orders         Ordered    doxycycline (VIBRAMYCIN) 100 MG capsule  2 times daily     11/12/17 1501    predniSONE (DELTASONE) 20 MG tablet  Daily     11/12/17 1501           Note: This dictation was prepared with Dragon dictation along with smaller phrase technology. Any transcriptional errors that result from this process are unintentional.         Marylene Land, NP 11/12/17 1536

## 2017-11-12 NOTE — Discharge Instructions (Signed)
Take medication as prescribed. Rest. Drink plenty of fluids. Continue home inhalers.   Follow up with your primary care physician this week as needed. Return to Urgent care for new or worsening concerns.

## 2017-11-12 NOTE — ED Triage Notes (Signed)
Pt states she has been fighting an "infection" for about a month. States she has been sick off and on even after antibiotics from her pcp. Did have a Chest CT on Friday that she doesn't have results from. States she is feeling bad, green stool, yellow mucus from productive cough. Tightness in her chest from coughing, headache and runny nose.

## 2017-11-13 ENCOUNTER — Other Ambulatory Visit: Payer: Self-pay

## 2017-11-13 ENCOUNTER — Emergency Department: Payer: Medicare Other

## 2017-11-13 ENCOUNTER — Emergency Department
Admission: EM | Admit: 2017-11-13 | Discharge: 2017-11-13 | Disposition: A | Discharge status: Home / Self Care | Payer: Medicare Other | Attending: Emergency Medicine | Admitting: Emergency Medicine

## 2017-11-13 DIAGNOSIS — R1310 Dysphagia, unspecified: Secondary | ICD-10-CM | POA: Insufficient documentation

## 2017-11-13 DIAGNOSIS — Z87891 Personal history of nicotine dependence: Secondary | ICD-10-CM | POA: Diagnosis not present

## 2017-11-13 DIAGNOSIS — Z79899 Other long term (current) drug therapy: Secondary | ICD-10-CM | POA: Insufficient documentation

## 2017-11-13 DIAGNOSIS — E039 Hypothyroidism, unspecified: Secondary | ICD-10-CM | POA: Insufficient documentation

## 2017-11-13 LAB — CBC WITH DIFFERENTIAL/PLATELET
Abs Immature Granulocytes: 0.05 10*3/uL (ref 0.00–0.07)
BASOS ABS: 0 10*3/uL (ref 0.0–0.1)
BASOS PCT: 0 %
Eosinophils Absolute: 0 10*3/uL (ref 0.0–0.5)
Eosinophils Relative: 0 %
HCT: 40.4 % (ref 36.0–46.0)
Hemoglobin: 13.1 g/dL (ref 12.0–15.0)
Immature Granulocytes: 0 %
LYMPHS PCT: 6 %
Lymphs Abs: 0.7 10*3/uL (ref 0.7–4.0)
MCH: 26.6 pg (ref 26.0–34.0)
MCHC: 32.4 g/dL (ref 30.0–36.0)
MCV: 82.1 fL (ref 80.0–100.0)
MONO ABS: 0.6 10*3/uL (ref 0.1–1.0)
Monocytes Relative: 5 %
NRBC: 0 % (ref 0.0–0.2)
Neutro Abs: 11.3 10*3/uL — ABNORMAL HIGH (ref 1.7–7.7)
Neutrophils Relative %: 89 %
PLATELETS: 356 10*3/uL (ref 150–400)
RBC: 4.92 MIL/uL (ref 3.87–5.11)
RDW: 14.3 % (ref 11.5–15.5)
WBC: 12.7 10*3/uL — AB (ref 4.0–10.5)

## 2017-11-13 LAB — COMPREHENSIVE METABOLIC PANEL
ALT: 15 U/L (ref 0–44)
AST: 23 U/L (ref 15–41)
Albumin: 4.1 g/dL (ref 3.5–5.0)
Alkaline Phosphatase: 86 U/L (ref 38–126)
Anion gap: 9 (ref 5–15)
BUN: 18 mg/dL (ref 8–23)
CHLORIDE: 102 mmol/L (ref 98–111)
CO2: 28 mmol/L (ref 22–32)
CREATININE: 0.69 mg/dL (ref 0.44–1.00)
Calcium: 9.2 mg/dL (ref 8.9–10.3)
GFR calc Af Amer: >60 mL/min (ref >60)
GFR calc non Af Amer: >60 mL/min (ref >60)
Glucose, Bld: 131 mg/dL — ABNORMAL HIGH (ref 70–99)
POTASSIUM: 4.1 mmol/L (ref 3.5–5.1)
SODIUM: 139 mmol/L (ref 135–145)
Total Bilirubin: 0.5 mg/dL (ref 0.3–1.2)
Total Protein: 7.7 g/dL (ref 6.5–8.1)

## 2017-11-13 MED ORDER — SODIUM CHLORIDE 0.9 % IV BOLUS
1000.0000 mL | Freq: Once | INTRAVENOUS | Status: AC
  Administered 2017-11-13: 1000 mL via INTRAVENOUS

## 2017-11-13 MED ORDER — IOHEXOL 300 MG/ML  SOLN
75.0000 mL | Freq: Once | INTRAMUSCULAR | Status: AC | PRN
  Administered 2017-11-13: 75 mL via INTRAVENOUS

## 2017-11-13 MED ORDER — ONDANSETRON HCL 4 MG/2ML IJ SOLN: 4.0000 mg | Freq: Once | INTRAMUSCULAR | Status: DC

## 2017-11-13 NOTE — ED Provider Notes (Signed)
[facility] Emergency Department Provider Note  ____________________________________________   I have reviewed the triage vital signs and the nursing notes.   HISTORY  Chief Complaint Unable to swallow food or liquid   History limited by: Not Limited   HPI Kimberly Burch is a 77 y.o. female who presents to the emergency department today because of concerns for difficulty with swallowing food and liquid.  Patient states that her symptoms have been going on for the past couple of days.  She feels like when she tries to swallow things it caught near the bottom of her neck.  She then will spit it up.  Patient denies any abdominal pain.  She is also been coughing up a lot of phlegm.  She denies any fevers.  Has not had similar symptoms in the past.  Per medical record review patient has a history of lung cancer  Past Medical History:  Diagnosis Date  . Cancer ([place]) 07/2014   lung cancer- couldn't do surgery. Radiation and chemo.   . Personal history of chemotherapy   . Personal history of radiation therapy   . Skin cancer   . Thyroid disease     Patient Active Problem List   Diagnosis Date Noted  . Atherosclerosis of aorta ([place]) 07/01/2017  . Allergic rhinitis 01/26/2017  . Nocturnal leg cramps 08/16/2016  . History of lung cancer 07/16/2016  . Hypothyroidism 07/16/2016  . Gait instability 07/16/2016  . Vitamin D deficiency 07/16/2016  . Osteoarthritis 07/16/2016    Past Surgical History:  Procedure Laterality Date  . INNER EAR SURGERY     for hearing loss. L ear.  . TONSILECTOMY, ADENOIDECTOMY, BILATERAL MYRINGOTOMY AND TUBES    . TUBAL LIGATION      Prior to Admission medications   Medication Sig Start Date End Date Taking? Authorizing Provider  doxycycline (VIBRAMYCIN) 100 MG capsule Take 1 capsule (100 mg total) by mouth 2 (two) times daily. 11/12/17   Marylene Land, NP  fluticasone-salmeterol (ADVAIR HFA) [id number] MCG/ACT inhaler Inhale 2  puffs into the lungs 2 (two) times daily. 10/05/17   Flora Lipps, MD  levothyroxine (SYNTHROID, LEVOTHROID) 50 MCG tablet Take 1 tablet (50 mcg total) by mouth daily before breakfast. 10/05/17   Glean Hess, MD  loratadine (ALLERGY RELIEF) 10 MG tablet Take 10 mg by mouth daily.    [provider]  Multiple Vitamins-Minerals (VISION FORMULA EYE HEALTH PO) Take 4 tablets by mouth daily.    [provider]  predniSONE (DELTASONE) 20 MG tablet Take 2 tablets (40 mg total) by mouth daily. 11/12/17   Marylene Land, NP  Tiotropium Bromide Monohydrate (SPIRIVA RESPIMAT) 1.25 MCG/ACT AERS Inhale 2 puffs into the lungs daily. 10/06/17   Flora Lipps, MD    Allergies Patient has no known allergies.  Family History  Problem Relation Age of Onset  . Diabetes Father   . Hyperlipidemia Father   . Cancer Maternal Aunt   . Cancer Paternal Grandmother   . Breast cancer Paternal Grandmother 49  . Heart disease Mother     Social History Social History   Tobacco Use  . Smoking status: Former Smoker    Packs/day: 2.00    Years: 30.00    Pack years: 60.00    Types: Cigarettes    Last attempt to quit: 1995    Years since quitting: 24.7  . Smokeless tobacco: Never Used  . Tobacco comment: smoking cessation materials not required  Substance Use Topics  . Alcohol use:  No  . Drug use: No    Review of Systems Constitutional: No fever/chills Eyes: No visual changes. ENT: No sore throat. Cardiovascular: Denies chest pain. Respiratory: Denies shortness of breath. Gastrointestinal: No abdominal pain.  Positive for difficulty with swallowing.  Genitourinary: Negative for dysuria. Musculoskeletal: Negative for back pain. Skin: Negative for rash. Neurological: Negative for headaches, focal weakness or numbness.  ____________________________________________   PHYSICAL EXAM:  VITAL SIGNS: ED Triage Vitals  Enc Vitals Group     BP 11/13/17 1753 132/77     Pulse Rate  11/13/17 1753 (!) 114     Resp 11/13/17 1753 16     Temp 11/13/17 1753 98 F (36.7 C)     Temp Source 11/13/17 1753 Oral     SpO2 11/13/17 1753 98 %     Weight 11/13/17 1755 117 lb (53.1 kg)     Height 11/13/17 1755 5\' 3"  (1.6 m)     Head Circumference --      Peak Flow --      Pain Score 11/13/17 1813 0   Constitutional: Alert and oriented.  Eyes: Conjunctivae are normal.  ENT      Head: Normocephalic and atraumatic.      Nose: No congestion/rhinnorhea.      Mouth/Throat: Mucous membranes are moist.      Neck: No stridor. Hematological/Lymphatic/Immunilogical: No cervical lymphadenopathy. Cardiovascular: Normal rate, regular rhythm.  No murmurs, rubs, or gallops.  Respiratory: Normal respiratory effort without tachypnea nor retractions. Breath sounds are clear and equal bilaterally. No wheezes/rales/rhonchi. Gastrointestinal: Soft and non tender. No rebound. No guarding.  Genitourinary: Deferred Musculoskeletal: Normal range of motion in all extremities. No lower extremity edema. Neurologic:  Normal speech and language. No gross focal neurologic deficits are appreciated.  Skin:  Skin is warm, dry and intact. No rash noted. Psychiatric: Mood and affect are normal. Speech and behavior are normal. Patient exhibits appropriate insight and judgment.  ____________________________________________    LABS (pertinent positives/negatives)  CBC wbc 12.7, hgb 13.1, plt 356 CMP wnl except glu 131  ____________________________________________   EKG  None  ____________________________________________    RADIOLOGY  CT soft tissue neck No obvious etiology of dysphagai  ____________________________________________   PROCEDURES  Procedures  ____________________________________________   INITIAL IMPRESSION / ASSESSMENT AND PLAN / ED COURSE  Pertinent labs & imaging results that were available during my care of the patient were reviewed by me and considered in my medical  decision making (see chart for details).   Patient presented to the emergency department today with concerns for difficulty with swallowing.  On exam here patient appears to be able to manage her secretions.  She is not spitting up.  CT soft tissue neck was performed.  No obvious mass.  Discussed this with the patient.  Will plan on giving patient GI follow-up information.  Patient did feel better after IV fluids.   ____________________________________________   FINAL CLINICAL IMPRESSION(S) / ED DIAGNOSES  Final diagnoses:  Dysphagia, unspecified type     Note: This dictation was prepared with Dragon dictation. Any transcriptional errors that result from this process are unintentional     Nance Pear, MD 11/13/17 2046

## 2017-11-13 NOTE — ED Notes (Signed)
Pt states she has been having problems with "phlegm" for "awhile". That she eats and she coughs up phlegm that changes color dependent on what she eats or drinks.  For the last 3 days she states she is unable to eat anything, that it goes down "so far and then comes back up".

## 2017-11-13 NOTE — Progress Notes (Deleted)
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  She currently feels well and is at her baseline.  She is worried about her thyroid levels since she has not been able to be seen by her primary care recently.  She has no neurologic complaints. She denies any recent fevers or illnesses. She has a good appetite and denies weight loss. She has no chest pain, shortness of breath, cough, or hemoptysis. She denies any nausea, vomiting, constipation, or diarrhea. She has no urinary complaints.  Patient offers no specific complaints today.  REVIEW OF SYSTEMS:   Review of Systems  Constitutional: Negative.  Negative for fever, malaise/fatigue and weight loss.  Respiratory: Negative.  Negative for cough, hemoptysis and shortness of breath.   Cardiovascular: Negative.  Negative for chest pain and leg swelling.  Gastrointestinal: Negative.  Negative for abdominal pain.  Genitourinary: Negative.   Musculoskeletal: Negative.   Skin: Negative.  Negative for rash.  Neurological: Negative.  Negative for sensory change, focal weakness and weakness.  Psychiatric/Behavioral: Negative.  The patient is not nervous/anxious.     As per HPI. Otherwise, a complete review of systems is negative.  PAST MEDICAL HISTORY: Past Medical History:  Diagnosis Date  . Cancer ([place]) 07/2014   lung cancer- couldn't do surgery.  Radiation and chemo.   . Personal history of chemotherapy   . Personal history of radiation therapy   . Skin cancer   . Thyroid disease     PAST SURGICAL HISTORY: Past Surgical History:  Procedure Laterality Date  . INNER EAR SURGERY     for hearing loss. L ear.  . TONSILECTOMY, ADENOIDECTOMY, BILATERAL MYRINGOTOMY AND TUBES    . TUBAL LIGATION      FAMILY HISTORY: Family History  Problem Relation Age of Onset  . Diabetes Father   . Hyperlipidemia Father   . Cancer Maternal Aunt   . Cancer Paternal Grandmother   . Breast cancer Paternal Grandmother 2  . Heart disease Mother     ADVANCED DIRECTIVES (Y/N):  N  HEALTH MAINTENANCE: Social History   Tobacco Use  . Smoking status: Former Smoker    Packs/day: 2.00    Years: 30.00    Pack years: 60.00    Types: Cigarettes    Last attempt to quit: 1995    Years since quitting: 24.7  . Smokeless tobacco: Never Used  . Tobacco comment: smoking cessation materials not required  Substance Use Topics  . Alcohol use: No  . Drug use: No     Colonoscopy:  PAP:  Bone density:  Lipid panel:  No Known Allergies  No current facility-administered medications for this visit.    Current Outpatient Medications  Medication Sig Dispense Refill  . doxycycline (VIBRAMYCIN) 100 MG capsule Take 1 capsule (100 mg total) by mouth 2 (two) times daily. 20 capsule 0  . fluticasone-salmeterol (ADVAIR HFA) 115-21 MCG/ACT inhaler Inhale 2 puffs into the lungs 2 (two) times daily. 36 g 2  . levothyroxine (SYNTHROID, LEVOTHROID)  50 MCG tablet Take 1 tablet (50 mcg total) by mouth daily before breakfast. 90 tablet 2  . loratadine (ALLERGY RELIEF) 10 MG tablet Take 10 mg by mouth daily.    . Multiple Vitamins-Minerals (VISION FORMULA EYE HEALTH PO) Take 4 tablets by mouth daily.    . predniSONE (DELTASONE) 20 MG tablet Take 2 tablets (40 mg total) by mouth daily. 10 tablet 0  . Tiotropium Bromide Monohydrate (SPIRIVA RESPIMAT) 1.25 MCG/ACT AERS  Inhale 2 puffs into the lungs daily. 12 g 3   Facility-Administered Medications Ordered in Other Visits  Medication Dose Route Frequency Provider Last Rate Last Dose  . ondansetron (ZOFRAN) injection 4 mg  4 mg Intravenous Once Nance Pear, MD        OBJECTIVE: There were no vitals filed for this visit.   There is no height or weight on file to calculate BMI.    ECOG FS:0 - Asymptomatic  General: Well-developed, well-nourished, no acute distress. Eyes: Pink conjunctiva, anicteric sclera. HEENT: Normocephalic, moist mucous membranes, clear oropharnyx. Lungs: Clear to auscultation bilaterally. Heart: Regular rate and rhythm. No rubs, murmurs, or gallops. Abdomen: Soft, nontender, nondistended. No organomegaly noted, normoactive bowel sounds. Musculoskeletal: No edema, cyanosis, or clubbing. Neuro: Alert, answering all questions appropriately. Cranial nerves grossly intact. Skin: No rashes or petechiae noted. Psych: Normal affect.  LAB RESULTS:  Lab Results  Component Value Date   NA 139 11/13/2017   K 4.1 11/13/2017   CL 102 11/13/2017   CO2 28 11/13/2017   GLUCOSE 131 (H) 11/13/2017   BUN 18 11/13/2017   CREATININE 0.69 11/13/2017   CALCIUM 9.2 11/13/2017   PROT 7.7 11/13/2017   ALBUMIN 4.1 11/13/2017   AST 23 11/13/2017   ALT 15 11/13/2017   ALKPHOS 86 11/13/2017   BILITOT 0.5 11/13/2017   GFRNONAA >60 11/13/2017   GFRAA >60 11/13/2017    Lab Results  Component Value Date   WBC 12.7 (H) 11/13/2017   NEUTROABS 11.3 (H) 11/13/2017   HGB 13.1 11/13/2017   HCT 40.4 11/13/2017   MCV 82.1 11/13/2017   PLT 356 11/13/2017     STUDIES: Ct Soft Tissue Neck W Contrast  Result Date: 11/13/2017 CLINICAL DATA:  Unable to swallow food or liquid past 3 days. Lung cancer. EXAM: CT NECK WITH CONTRAST TECHNIQUE: Multidetector CT imaging of the neck was performed using the standard protocol following the bolus administration of intravenous contrast. CONTRAST:  74mL  OMNIPAQUE IOHEXOL 300 MG/ML  SOLN COMPARISON:  CT chest is correlated, 11/11/2017. FINDINGS: Pharynx and larynx: Normal. No mass or swelling. Salivary glands: No inflammation, mass, or stone. Thyroid: Normal. Lymph nodes: None enlarged or abnormal density. Vascular: Patent. Atheromatous change at the carotid bifurcations appears nonstenotic Limited intracranial: Negative. Visualized orbits: Limited visualization.  Negative. Mastoids and visualized paranasal sinuses: Clear mastoids. No significant sinus disease. Skeleton: Spondylosis. No concerning osseous abnormality. Mild pannus. Upper chest: Reported separately. Post therapeutic change RIGHT hemithorax. Port-A-Cath unchanged. Other: None. IMPRESSION: CT neck is unremarkable. No cause is seen for the reported symptoms. Electronically Signed   By: Staci Righter M.D.   On: 11/13/2017 20:30   Ct Chest W Contrast  Result Date: 11/11/2017 CLINICAL DATA:  Squamous cell carcinoma. EXAM: CT CHEST WITH CONTRAST TECHNIQUE: Multidetector CT imaging of the chest was performed during intravenous contrast administration. CONTRAST:  56mL OMNIPAQUE IOHEXOL 300 MG/ML  SOLN COMPARISON:  05/16/2017 FINDINGS: Cardiovascular: Heart is enlarged. Coronary artery calcification is evident. Atherosclerotic calcification is noted in the wall of the thoracic  aorta. Left Port-A-Cath tip is in the proximal SVC. Mediastinum/Nodes: Similar appearance of scattered small mediastinal lymph nodes. The 7 mm short axis AP window lymph node measured previously is stable at 7 mm. Clustered know if nodes in the high right paratracheal space are slightly more prominent than before with 1 of the more dominant nodes measuring 7 mm short axis (23/2). There is no axillary lymphadenopathy. The esophagus has normal imaging features. Lungs/Pleura: Volume loss in the right hemithorax is stable. The central tracheobronchial airways are patent. Similar appearance of the bronchiectasis and consolidative change  in the right mid and lower lung including the dominant cavitary process in the posterior right costophrenic sulcus. No new suspicious pulmonary nodule or mass in the left lung. No left pleural effusion. Upper Abdomen: Gallstones again noted. 8 mm low-density posterior right liver lesion is similar to prior. Interval development of cystic change in the splenic hilum measuring 1.5 x 2.2 cm is indeterminate. Musculoskeletal: No worrisome lytic or sclerotic osseous abnormality. IMPRESSION: 1. Similar volume loss loss right hemithorax with stable appearance of presumed scarring/consolidation in the posterior right lower chest. 2. Mild progression of nonenlarged lymph nodes in the high right paratracheal region. Attention on follow-up. 3. Interval development of a 2.2 cm low-density lesion in the splenic hilum, indeterminate. Close attention on follow-up recommended. 4. No change indeterminate 8 mm posterior right liver lesion. 5. Cholelithiasis. Electronically Signed   By: Misty Stanley M.D.   On: 11/11/2017 14:49   Dexascan  Result Date: 10/24/2017 EXAM: DUAL X-RAY ABSORPTIOMETRY (DXA) FOR BONE MINERAL DENSITY IMPRESSION: Dear Dr Halina Maidens, Your patient Kandance Yano completed a BMD test on 10/24/2017 using the [place] (analysis version: 14.10) manufactured by [employer]. The following summarizes the results of our evaluation. PATIENT BIOGRAPHICAL: Name: Teva, Bronkema Patient ID: [id number] Birth Date: Sep 17, 1940 Height: 62.5 in. Gender: Female Exam Date: 10/24/2017 Weight: 122.0 lbs. Indications: Advanced Age, Caucasian, copd, Height Loss, History of Fracture (Adult), Low Body Weight, lung ca, POSTmenopausal, skin ca Fractures: FINGER Treatments: Advair, levothyroxine, multivitamin, SPIRIVA ASSESSMENT: The BMD measured at Forearm Radius 33% is 0.575 g/cm2 with a T-score of -3.4. This patient is considered osteoporotic according to [facility]) criteria. L-4 was  excluded due to degenerative changes. Scan quality is limited by exclusion of L-4. Site Region Measured Measured WHO Young Adult BMD Date       Age      Classification T-score AP Spine L1-L3 10/24/[age] 77.6 Osteopenia -1.3 1.011 g/cm2 DualFemur Neck Left 10/24/2017 77.6 Osteopenia -2.0 0.759 g/cm2 Left Forearm Radius 33% 10/24/2017 77.6 Osteoporosis -3.4 0.575 g/cm2 World Health Organization [facility]) criteria for post-menopausal, Caucasian Women: Normal:       T-score at or above -1 SD Osteopenia:   T-score between -1 and -2.5 SD Osteoporosis: T-score at or below -2.5 SD RECOMMENDATIONS: 1. All patients should optimize calcium and vitamin D intake. 2. Consider FDA-approved medical therapies in postmenopausal women and men aged 21 years and older, based on the following: a. A hip or vertebral (clinical or morphometric) fracture b. T-score < -2.5 at the femoral neck or spine after appropriate evaluation to exclude secondary causes c. Low bone mass (T-score between -1.0 and -2.5 at the femoral neck or spine) and a 10-year probability of a hip fracture > 3% or a 10-year probability of a major osteoporosis-related fracture > 20% based on the [place]-adapted WHO algorithm d. Clinician judgment and/or patient preferences may indicate treatment for people with 10-year fracture probabilities  above or below these levels FOLLOW-UP: Patients with diagnosed cases of osteoporosis or at high risk for fracture should have regular bone mineral density tests. For patients eligible for Medicare, routine testing is allowed once every 2 years. The testing frequency can be increased to one year for patients who have rapidly progressing disease, those who are receiving or discontinuing medical therapy to restore bone mass, or have additional risk factors. I have reviewed this report, and agree with the above findings. [facility] Electronically Signed   By: Nolon Nations M.D.   On: 10/24/2017 11:02   Mm 3d Screen Breast  Bilateral  Result Date: 10/31/2017 CLINICAL DATA:  Screening. EXAM: DIGITAL SCREENING BILATERAL MAMMOGRAM WITH TOMO AND CAD COMPARISON:  Previous exam(s). ACR Breast Density Category b: There are scattered areas of fibroglandular density. FINDINGS: There are no findings suspicious for malignancy. Images were processed with CAD. IMPRESSION: No mammographic evidence of malignancy. A result letter of this screening mammogram will be mailed directly to the patient. RECOMMENDATION: Screening mammogram in one year. (Code:SM-B-01Y) BI-RADS CATEGORY  1: Negative. Electronically Signed   By: Kristopher Oppenheim M.D.   On: 10/31/2017 13:49   Mm Outside Films Mammo  Result Date: 10/31/2017 This examination belongs to an outside facility and is stored here for comparison purposes only.  Contact the originating outside institution for any associated report or interpretation.   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 She then received 2 cycles of consolidation treatment completing on November 15, 2014. She had a bronchoscopy performed on March 02, 2016 which did not reveal any signs of recurrent or progressive disease.  Her most recent CT scan on May 16, 2017 reviewed independently and reported as above with no definitive new or progressive disease.  No intervention is needed at this time.  Return to clinic in 6 months with repeat imaging and further evaluation.  If her CT scan remains negative at that point, she can be switched to yearly visits. 2.  Port: We briefly discussed removing her port today, the patient wishes to have it remain in until she is 3 years out from completing her treatments.  Continue port flush every 6 weeks. 3.  Thyroid: Have ordered a TSH with thyroid panel today.  Will forward results to patient's new primary care  physician.  Approximately 30 minutes was spent in discussion of which greater than 50% consultation.  Patient expressed understanding and was in agreement with this plan. She also understands that She can call clinic at any time with any questions, concerns, or complaints.   Cancer Staging History of lung cancer Staging form: Lung, AJCC 8th Edition - Clinical stage from 07/30/2016: Stage IIIB (cT4, cN2, cM0) - Signed by Lloyd Huger, MD on 07/30/2016   Lloyd Huger, MD   11/13/2017 9:44 PM

## 2017-11-13 NOTE — Discharge Instructions (Addendum)
Please seek medical attention for any high fevers, chest pain, shortness of breath, change in behavior, persistent vomiting, bloody stool or any other new or concerning symptoms.  

## 2017-11-13 NOTE — ED Triage Notes (Signed)
Pt states she has not been able to swallow food or liquid for the past 3 days. Pt has a hx of lung CA and has routine check up and just had a CT on Friday but does not have the results/. Was seen at [facility] yesterday and Rx abx and prednisone. States she is not sure if the meds are going down bc the liquids are coming back up

## 2017-11-14 ENCOUNTER — Other Ambulatory Visit: Payer: Self-pay | Admitting: Gastroenterology

## 2017-11-14 DIAGNOSIS — R1314 Dysphagia, pharyngoesophageal phase: Secondary | ICD-10-CM

## 2017-11-15 ENCOUNTER — Ambulatory Visit
Admission: RE | Admit: 2017-11-15 | Discharge: 2017-11-15 | Disposition: A | Discharge status: Home / Self Care | Payer: Medicare Other | Source: Ambulatory Visit | Attending: Gastroenterology | Admitting: Gastroenterology

## 2017-11-15 DIAGNOSIS — R918 Other nonspecific abnormal finding of lung field: Secondary | ICD-10-CM | POA: Insufficient documentation

## 2017-11-15 DIAGNOSIS — J479 Bronchiectasis, uncomplicated: Secondary | ICD-10-CM | POA: Insufficient documentation

## 2017-11-15 DIAGNOSIS — R1314 Dysphagia, pharyngoesophageal phase: Secondary | ICD-10-CM

## 2017-11-16 DIAGNOSIS — J860 Pyothorax with fistula: Secondary | ICD-10-CM | POA: Insufficient documentation

## 2017-11-16 HISTORY — PX: ESOPHAGOSCOPY W/ PERCUTANEOUS GASTROSTOMY TUBE PLACEMENT: SUR463

## 2017-11-17 DIAGNOSIS — Z789 Other specified health status: Secondary | ICD-10-CM | POA: Insufficient documentation

## 2017-11-17 DIAGNOSIS — E440 Moderate protein-calorie malnutrition: Secondary | ICD-10-CM | POA: Insufficient documentation

## 2017-11-18 ENCOUNTER — Inpatient Hospital Stay: Payer: Medicare Other | Admitting: Oncology

## 2017-11-20 NOTE — Progress Notes (Signed)
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  She currently gets all of her nutrition via her PEG tube.  She otherwise feels well.  She has no neurologic complaints. She denies any recent fevers or illnesses. She has no chest pain, shortness of breath, cough, or hemoptysis. She denies any nausea, vomiting, constipation, or diarrhea. She has no urinary complaints.  Patient offers no further specific complaints today.  REVIEW OF SYSTEMS:   Review of Systems  Constitutional: Negative.  Negative for fever, malaise/fatigue and weight loss.  Respiratory: Negative.  Negative for cough, hemoptysis and shortness of breath.   Cardiovascular: Negative.  Negative for chest pain and leg swelling.  Gastrointestinal: Negative.  Negative for abdominal pain.  Genitourinary: Negative.  Negative for dysuria.  Musculoskeletal: Negative.  Negative for back pain.  Skin: Negative.  Negative for rash.  Neurological: Negative.  Negative for dizziness, sensory change, focal weakness and weakness.  Psychiatric/Behavioral: Negative.  The patient is not nervous/anxious.     As per HPI. Otherwise, a complete review of systems is negative.  PAST MEDICAL HISTORY: Past Medical History:  Diagnosis Date  .  Cancer ([place]) 07/2014   lung cancer- couldn't do surgery. Radiation and chemo.   . Personal history of chemotherapy   . Personal history of radiation therapy   . Skin cancer   . Thyroid disease     PAST SURGICAL HISTORY: Past Surgical History:  Procedure Laterality Date  . INNER EAR SURGERY     for hearing loss. L ear.  . TONSILECTOMY, ADENOIDECTOMY, BILATERAL MYRINGOTOMY AND TUBES    . TUBAL LIGATION      FAMILY HISTORY: Family History  Problem Relation Age of Onset  . Diabetes Father   . Hyperlipidemia Father   . Cancer Maternal Aunt   . Cancer Paternal Grandmother   . Breast cancer Paternal Grandmother 44  . Heart disease Mother     ADVANCED DIRECTIVES (Y/N):  N  HEALTH MAINTENANCE: Social History   Tobacco Use  . Smoking status: Former Smoker    Packs/day: 2.00    Years: 30.00    Pack years: 60.00    Types: Cigarettes    Last attempt to quit: 1995    Years since quitting: 24.8  . Smokeless tobacco: Never Used  . Tobacco comment: smoking cessation materials not required  Substance Use Topics  . Alcohol use: No  . Drug use: No     Colonoscopy:  PAP:  Bone density:  Lipid panel:  No Known Allergies  Current Outpatient Medications  Medication Sig Dispense Refill  . acetaminophen (TYLENOL) 160 MG/5ML suspension Take by mouth.    . Gabapentin 300 MG/6ML SOLN Take 2 mLs (100 mg total) by G tube every 12 (twelve) hours    . levothyroxine (SYNTHROID, LEVOTHROID) 25 MCG tablet 50 mcg by Per J Tube route.     . Multiple Vitamins-Minerals (  TROPICAL LIQUID NUTRITION) LIQD Take by mouth.    Marland Kitchen omeprazole (PRILOSEC) 2 mg/mL SUSP Take 10 mLs (20 mg total) by G tube once daily    . ondansetron (ZOFRAN-ODT) 4 MG disintegrating tablet DIS ONE T PO  Q 8 H PRN N  3  . oxyCODONE (ROXICODONE) 5 MG/5ML solution TK 5 ML PO Q 6 H PRN  0  . Sennosides (SENNA) 8.8 MG/5ML SYRP TK 5 ML VIA G-TUBE BID  3  . fluticasone-salmeterol (ADVAIR HFA) 115-21 MCG/ACT inhaler Inhale 2 puffs  into the lungs 2 (two) times daily. (Patient not taking: Reported on 11/25/2017) 36 g 2  . levothyroxine (SYNTHROID, LEVOTHROID) 50 MCG tablet Take 1 tablet (50 mcg total) by mouth daily before breakfast. (Patient not taking: Reported on 11/25/2017) 90 tablet 2  . loratadine (ALLERGY RELIEF) 10 MG tablet Take 10 mg by mouth daily.    . Multiple Vitamins-Minerals (VISION FORMULA EYE HEALTH PO) Take 4 tablets by mouth daily.    . predniSONE (DELTASONE) 20 MG tablet Take 2 tablets (40 mg total) by mouth daily. (Patient not taking: Reported on 11/25/2017) 10 tablet 0  . Tiotropium Bromide Monohydrate (SPIRIVA RESPIMAT) 1.25 MCG/ACT AERS Inhale 2 puffs into the lungs daily. (Patient not taking: Reported on 11/25/2017) 12 g 3   No current facility-administered medications for this visit.     OBJECTIVE: Vitals:   11/25/17 1008  BP: 99/66  Pulse: (!) 101  Resp: 18  Temp: (!) 96.8 F (36 C)     Body mass index is 21.35 kg/m.    ECOG FS:0 - Asymptomatic  General: Well-developed, well-nourished, no acute distress. Eyes: Pink conjunctiva, anicteric sclera. HEENT: Normocephalic, moist mucous membranes, clear oropharnyx. Lungs: Clear to auscultation bilaterally. Heart: Regular rate and rhythm. No rubs, murmurs, or gallops. Abdomen: Soft, nontender, nondistended. No organomegaly noted, normoactive bowel sounds.  PEG tube in place. Musculoskeletal: No edema, cyanosis, or clubbing. Neuro: Alert, answering all questions appropriately. Cranial nerves grossly intact. Skin: No rashes or petechiae noted. Psych: Normal affect.  LAB RESULTS:  Lab Results  Component Value Date   NA 139 11/13/2017   K 4.1 11/13/2017   CL 102 11/13/2017   CO2 28 11/13/2017   GLUCOSE 131 (H) 11/13/2017   BUN 18 11/13/2017   CREATININE 0.69 11/13/2017   CALCIUM 9.2 11/13/2017   PROT 7.7 11/13/2017   ALBUMIN 4.1 11/13/2017   AST 23 11/13/2017   ALT 15 11/13/2017   ALKPHOS 86 11/13/2017   BILITOT 0.5 11/13/2017    GFRNONAA >60 11/13/2017   GFRAA >60 11/13/2017    Lab Results  Component Value Date   WBC 12.7 (H) 11/13/2017   NEUTROABS 11.3 (H) 11/13/2017   HGB 13.1 11/13/2017   HCT 40.4 11/13/2017   MCV 82.1 11/13/2017   PLT 356 11/13/2017     STUDIES: Ct Soft Tissue Neck W Contrast  Result Date: 11/13/2017 CLINICAL DATA:  Unable to swallow food or liquid past 3 days. Lung cancer. EXAM: CT NECK WITH CONTRAST TECHNIQUE: Multidetector CT imaging of the neck was performed using the standard protocol following the bolus administration of intravenous contrast. CONTRAST:  71mL OMNIPAQUE IOHEXOL 300 MG/ML  SOLN COMPARISON:  CT chest is correlated, 11/11/2017. FINDINGS: Pharynx and larynx: Normal. No mass or swelling. Salivary glands: No inflammation, mass, or stone. Thyroid: Normal. Lymph nodes: None enlarged or abnormal density. Vascular: Patent. Atheromatous change at the carotid bifurcations appears nonstenotic Limited intracranial: Negative. Visualized orbits: Limited visualization.  Negative. Mastoids and visualized paranasal sinuses:  Clear mastoids. No significant sinus disease. Skeleton: Spondylosis. No concerning osseous abnormality. Mild pannus. Upper chest: Reported separately. Post therapeutic change RIGHT hemithorax. Port-A-Cath unchanged. Other: None. IMPRESSION: CT neck is unremarkable. No cause is seen for the reported symptoms. Electronically Signed   By: Staci Righter M.D.   On: 11/13/2017 20:30   Ct Chest W Contrast  Result Date: 11/11/2017 CLINICAL DATA:  Squamous cell carcinoma. EXAM: CT CHEST WITH CONTRAST TECHNIQUE: Multidetector CT imaging of the chest was performed during intravenous contrast administration. CONTRAST:  59mL OMNIPAQUE IOHEXOL 300 MG/ML  SOLN COMPARISON:  05/16/2017 FINDINGS: Cardiovascular: Heart is enlarged. Coronary artery calcification is evident. Atherosclerotic calcification is noted in the wall of the thoracic aorta. Left Port-A-Cath tip is in the proximal SVC.  Mediastinum/Nodes: Similar appearance of scattered small mediastinal lymph nodes. The 7 mm short axis AP window lymph node measured previously is stable at 7 mm. Clustered know if nodes in the high right paratracheal space are slightly more prominent than before with 1 of the more dominant nodes measuring 7 mm short axis (23/2). There is no axillary lymphadenopathy. The esophagus has normal imaging features. Lungs/Pleura: Volume loss in the right hemithorax is stable. The central tracheobronchial airways are patent. Similar appearance of the bronchiectasis and consolidative change in the right mid and lower lung including the dominant cavitary process in the posterior right costophrenic sulcus. No new suspicious pulmonary nodule or mass in the left lung. No left pleural effusion. Upper Abdomen: Gallstones again noted. 8 mm low-density posterior right liver lesion is similar to prior. Interval development of cystic change in the splenic hilum measuring 1.5 x 2.2 cm is indeterminate. Musculoskeletal: No worrisome lytic or sclerotic osseous abnormality. IMPRESSION: 1. Similar volume loss loss right hemithorax with stable appearance of presumed scarring/consolidation in the posterior right lower chest. 2. Mild progression of nonenlarged lymph nodes in the high right paratracheal region. Attention on follow-up. 3. Interval development of a 2.2 cm low-density lesion in the splenic hilum, indeterminate. Close attention on follow-up recommended. 4. No change indeterminate 8 mm posterior right liver lesion. 5. Cholelithiasis. Electronically Signed   By: Misty Stanley M.D.   On: 11/11/2017 14:49   Dg Esophagus  Addendum Date: 11/15/2017   ADDENDUM REPORT: 11/15/2017 13:28 ADDENDUM: The body and the impression of the report should read a prominent esophageal right bronchial fistula is present. This was discussed with the patient caregiver. Electronically Signed   By: Marcello Moores  Register   On: 11/15/2017 13:28   Result  Date: 11/15/2017 CLINICAL DATA:  Dysphagia. EXAM: ESOPHOGRAM/BARIUM SWALLOW TECHNIQUE: Single contrast examination was performed using thin barium or water soluble. FLUOROSCOPY TIME:  Fluoroscopy Time:  1 minutes 48 seconds Radiation Exposure Index (if provided by the fluoroscopic device): 29.3 mGy Number of Acquired Spot Images: 35. COMPARISON:  CT 11/13/2017 and 11/11/2017. FINDINGS: Patient had difficulty drinking the effervescent crystals for double contrast barium swallow. Therefore single-contrast barium swallow performed. Central line noted with tip over SVC. Cervical esophagus is normal. A prominent tracheal to right bronchial fistula is present. Large cavity and prominent bronchiectasis noted in the right lower lobe fills with barium. Minimal amount of barium entered the stomach. Prominent right lower lobe atelectasis/consolidation. Right upper lobe atelectasis and consolidation also noted. IMPRESSION: Prominent right tracheobronchial fistula. This is most likely from patient's known malignancy/radiation therapy. Prominent cavity formation noted in the right lung base. Prominent bronchiectasis. I phoned this report to Gerarda Gunther at the time of this report. Electronically Signed: By: Marcello Moores  Register On:  11/15/2017 11:02   Dg Duanne Limerick W/o Kub  Result Date: 11/15/2017 CLINICAL DATA:  Dysphagia. EXAM: UPPER GI SERIES WITHOUT KUB TECHNIQUE: Routine upper GI series was performed with thin density barium. FLUOROSCOPY TIME:  Reference made to barium swallow report. COMPARISON:  CT 11/03/2017 and 11/11/2017. FINDINGS: Reference is made to barium swallow report. IMPRESSION: Reference is made to barium swallow report. Electronically Signed   By: Marcello Moores  Register   On: 11/15/2017 11:03   Mm Outside Films Mammo  Result Date: 10/31/2017 This examination belongs to an outside facility and is stored here for comparison purposes only.  Contact the originating outside institution for any associated report or  interpretation.   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 She then received 2 cycles of consolidation treatment completing on November 15, 2014. She had a bronchoscopy performed on March 02, 2016 which did not reveal any signs of recurrent or progressive disease.  Her most recent CT scan on November 11, 2017 reviewed independently report as above with no obvious evidence of recurrence.  Initial plan was to transition patient to yearly imaging, but given her new diagnosis of bronchoesophageal fistula will repeat imaging in 6 months.  Return to clinic 1 to 2 days later to discuss the results. 2.  Bronchoesophageal fistula: Continue follow-up and treatment per [facility]. 3.  PEG tube: Patient continues to be n.p.o. and gets all of her nutrition via PEG tube. 4.  Port: Continue flushes every 6 weeks. 5.  Thyroid: Patient has a mildly decreased T3 uptake ratio, but otherwise her thyroid panel was within normal limits.  Continue current dose of Synthroid as prescribed.    Patient expressed understanding and was in agreement with this plan. She also understands that She can call clinic at any time with any questions, concerns, or complaints.   Cancer Staging History of lung cancer Staging form: Lung, AJCC 8th Edition - Clinical stage from 07/30/2016: Stage IIIB (cT4, cN2, cM0) - Signed by Lloyd Huger, MD on 07/30/2016   Lloyd Huger, MD   11/28/2017 10:01 AM

## 2017-11-25 ENCOUNTER — Inpatient Hospital Stay: Payer: Medicare Other

## 2017-11-25 ENCOUNTER — Inpatient Hospital Stay (HOSPITAL_BASED_OUTPATIENT_CLINIC_OR_DEPARTMENT_OTHER): Payer: Medicare Other | Admitting: Oncology

## 2017-11-25 VITALS — BP 99/66 | HR 101 | Temp 96.8°F | Resp 18 | Wt 120.5 lb

## 2017-11-25 DIAGNOSIS — Z85828 Personal history of other malignant neoplasm of skin: Secondary | ICD-10-CM

## 2017-11-25 DIAGNOSIS — E079 Disorder of thyroid, unspecified: Secondary | ICD-10-CM

## 2017-11-25 DIAGNOSIS — Z85118 Personal history of other malignant neoplasm of bronchus and lung: Secondary | ICD-10-CM

## 2017-11-25 DIAGNOSIS — Z87891 Personal history of nicotine dependence: Secondary | ICD-10-CM

## 2017-11-25 DIAGNOSIS — J860 Pyothorax with fistula: Secondary | ICD-10-CM

## 2017-11-25 DIAGNOSIS — Z9221 Personal history of antineoplastic chemotherapy: Secondary | ICD-10-CM

## 2017-11-25 DIAGNOSIS — Z923 Personal history of irradiation: Secondary | ICD-10-CM

## 2017-11-25 DIAGNOSIS — Z800 Family history of malignant neoplasm of digestive organs: Secondary | ICD-10-CM

## 2017-11-25 DIAGNOSIS — Z931 Gastrostomy status: Secondary | ICD-10-CM

## 2017-11-26 LAB — THYROID PANEL WITH TSH
Free Thyroxine Index: 1.7 (ref 1.2–4.9)
T3 Uptake Ratio: 23 % — ABNORMAL LOW (ref 24–39)
T4, Total: 7.6 ug/dL (ref 4.5–12.0)
TSH: 4.28 u[IU]/mL (ref 0.450–4.500)

## 2017-11-28 ENCOUNTER — Encounter: Payer: Self-pay | Admitting: Internal Medicine

## 2017-11-28 DIAGNOSIS — Z931 Gastrostomy status: Secondary | ICD-10-CM | POA: Insufficient documentation

## 2017-12-14 ENCOUNTER — Ambulatory Visit (INDEPENDENT_AMBULATORY_CARE_PROVIDER_SITE_OTHER): Payer: Medicare Other | Admitting: Internal Medicine

## 2017-12-14 ENCOUNTER — Encounter: Payer: Self-pay | Admitting: Internal Medicine

## 2017-12-14 ENCOUNTER — Ambulatory Visit
Admission: RE | Admit: 2017-12-14 | Discharge: 2017-12-14 | Disposition: A | Discharge status: Home / Self Care | Payer: Medicare Other | Source: Ambulatory Visit | Attending: Internal Medicine | Admitting: Internal Medicine

## 2017-12-14 VITALS — BP 100/68 | HR 114 | Resp 16 | Ht 63.0 in | Wt 121.0 lb

## 2017-12-14 DIAGNOSIS — R0609 Other forms of dyspnea: Secondary | ICD-10-CM | POA: Diagnosis present

## 2017-12-14 DIAGNOSIS — J984 Other disorders of lung: Secondary | ICD-10-CM | POA: Diagnosis not present

## 2017-12-14 DIAGNOSIS — Z85118 Personal history of other malignant neoplasm of bronchus and lung: Secondary | ICD-10-CM | POA: Diagnosis not present

## 2017-12-14 DIAGNOSIS — J449 Chronic obstructive pulmonary disease, unspecified: Secondary | ICD-10-CM | POA: Diagnosis not present

## 2017-12-14 MED ORDER — DOXYCYCLINE HYCLATE 100 MG PO CAPS: 100.0000 mg | ORAL_CAPSULE | Freq: Two times a day (BID) | ORAL | 0 refills | Status: DC

## 2017-12-14 NOTE — Patient Instructions (Addendum)
Restart using spiriva and advair.  Will start a course of antibiotics.  Use your nebulizer at twice per day until you are feeling better.  Will send you for a chest x ray.

## 2017-12-14 NOTE — Addendum Note (Signed)
Addended by: Garnette Gunner K on: 12/14/2017 04:00 PM   Modules accepted: Orders

## 2017-12-14 NOTE — Progress Notes (Signed)
Name: Kimberly Burch MRN: [id number] DOB: 09/20/1940     CONSULTATION DATE: 09/14/2017 REFERRING MD : Grayland Ormond  CHIEF COMPLAINT: dyspnea with cough.   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 She then received 2 cycles of consolidation treatment completing on November 15, 2014. She had a bronchoscopy performed on March 02, 2016 which did not reveal any signs of recurrent or progressive disease.  However she has since developed a bronchoesophageal fistula requiring stenting as well as PEG tube placement, managed by [facility].  Her most recent CT also shows cavitary changes of the right lower lobe which have been present since previous scan on 11/15/2016.  She is being followed by Dr. Mortimer Fries and has been noted to have progressive dyspnea over the past year. She underwent esophageal stent placement on 11/16/17 at [facility]. She has been off  advair and spiriva since that time.  She has been coughing which has been productive of yellow sputum, she has also been having more trouble breathing. She is now back to eating soft solids and liquids. She has managed to increase her weight in the past month about 3-4 pounds.     **CT chest 11/11/17>> imaging personally reviewed, near complete atelectasis of the right lung with rightward mediastinal shift.  There is a right lower lobe cavitary lesions, not significantly changed in comparison with the previous scan on 11/15/2016.   PAST MEDICAL HISTORY :   has a past medical history of Cancer ([place]) (07/2014), Personal history of chemotherapy, Personal history of radiation therapy, Skin cancer, and Thyroid disease.  has a past surgical history that includes Tubal ligation; Inner ear surgery; Tonsilectomy, adenoidectomy, bilateral myringotomy and tubes; and Esophagoscopy w/ percutaneous gastrostomy tube placement (11/16/2017). Prior to Admission medications   Medication  Sig Start Date End Date Taking? Authorizing Provider  levothyroxine (SYNTHROID, LEVOTHROID) 50 MCG tablet Take 1 tablet (50 mcg total) by mouth daily before breakfast. 11/15/16  Yes Plonk, Gwyndolyn Saxon, MD  loratadine (ALLERGY RELIEF) 10 MG tablet Take 10 mg by mouth daily.   Yes [provider]  Multiple Vitamins-Minerals (VISION FORMULA EYE HEALTH PO) Take 4 tablets by mouth daily.   Yes [provider]   No Known Allergies   Current Outpatient Medications:  .  acetaminophen (TYLENOL) 160 MG/5ML suspension, Take by mouth., Disp: , Rfl:  .  levothyroxine (SYNTHROID, LEVOTHROID) 25 MCG tablet, 50 mcg by Per J Tube route. , Disp: , Rfl:  .  levothyroxine (SYNTHROID, LEVOTHROID) 50 MCG tablet, Take 1 tablet (50 mcg total) by mouth daily before breakfast., Disp: 90 tablet, Rfl: 2 .  loratadine (ALLERGY RELIEF) 10 MG tablet, Take 10 mg by mouth daily., Disp: , Rfl:  .  Multiple Vitamins-Minerals (TROPICAL LIQUID NUTRITION) LIQD, Take by mouth., Disp: , Rfl:  .  Multiple Vitamins-Minerals (VISION FORMULA EYE HEALTH PO), Take 4 tablets by mouth daily., Disp: , Rfl:  .  omeprazole (PRILOSEC) 2 mg/mL SUSP, Take 10 mLs (20 mg total) by G tube once daily, Disp: , Rfl:  .  ondansetron (ZOFRAN-ODT) 4 MG disintegrating tablet, DIS ONE T PO  Q 8 H PRN N, Disp: , Rfl: 3 .  fluticasone-salmeterol (ADVAIR HFA) 115-21 MCG/ACT inhaler, Inhale 2 puffs into the lungs 2 (two) times daily. (Patient not taking: Reported on 11/25/2017), Disp: 36 g, Rfl: 2 .  Tiotropium Bromide Monohydrate (SPIRIVA RESPIMAT) 1.25 MCG/ACT AERS, Inhale 2 puffs into the lungs daily. (Patient  not taking: Reported on 11/25/2017), Disp: 12 g, Rfl: 3    Review of Systems:  Constitutional: Feels well. Cardiovascular: Denies chest pain, exertional chest pain.  Pulmonary: Denies hemoptysis, pleuritic chest pain.   The remainder of systems were reviewed and were found to be negative other than what is documented in the HPI.     Physical Examination:   VS: BP 100/68 (BP Location: Left Arm, Cuff Size: Normal)   Pulse (!) 114   Resp 16   Ht 5\' 3"  (1.6 m)   Wt 121 lb (54.9 kg)   SpO2 97%   BMI 21.43 kg/m   General Appearance: No distress  Neuro:without focal findings, mental status, speech normal, alert and oriented HEENT: PERRLA, EOM intact Pulmonary: No wheezing, No rales  CardiovascularNormal S1,S2.  No m/r/g.  Abdomen: Benign, Soft, non-tender, No masses Renal:  No costovertebral tenderness  GU:  No performed at this time. Endoc: No evident thyromegaly, no signs of acromegaly or Cushing features Skin:   warm, no rashes, no ecchymosis  Extremities: normal, no cyanosis, clubbing.    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 Dyspnea. Aspiration pneumonitis.  #1 Advair. #2  Spiriva. #3  We will give course of antibiotics. Chest x-ray to rule out new pneumonia or pleural effusion. Follow-up in 1 month with Dr. Mortimer Fries.  Orders Placed This Encounter  Procedures  . DG Chest 2 View   Meds ordered this encounter  Medications  . doxycycline (VIBRAMYCIN) 100 MG capsule    Sig: Take 1 capsule (100 mg total) by mouth 2 (two) times daily.    Dispense:  10 capsule    Refill:  0   Return in about 1 month (around 01/13/2018) for follow up with Dr. Mortimer Fries. Marda Stalker, M.D., F.C.C.P.  Board Certified in Internal Medicine, Pulmonary Medicine, [place], and Sleep Medicine.  [facility] Office Number: [phone]

## 2017-12-16 ENCOUNTER — Ambulatory Visit: Payer: Medicare Other | Admitting: Internal Medicine

## 2017-12-19 ENCOUNTER — Telehealth: Payer: Self-pay | Admitting: Internal Medicine

## 2017-12-19 NOTE — Telephone Encounter (Signed)
No new changes were seen on the xray.

## 2017-12-19 NOTE — Telephone Encounter (Signed)
Pt aware cxr did not show pneumonia.

## 2017-12-19 NOTE — Telephone Encounter (Signed)
Patient calling to check status of results  Please call to discuss

## 2017-12-19 NOTE — Telephone Encounter (Signed)
Pt would like results of cxr.

## 2017-12-22 ENCOUNTER — Other Ambulatory Visit: Payer: Self-pay | Admitting: Internal Medicine

## 2017-12-23 ENCOUNTER — Ambulatory Visit (INDEPENDENT_AMBULATORY_CARE_PROVIDER_SITE_OTHER): Payer: Medicare Other | Admitting: Internal Medicine

## 2017-12-23 ENCOUNTER — Encounter: Payer: Self-pay | Admitting: Internal Medicine

## 2017-12-23 VITALS — BP 118/72 | HR 112 | Ht 63.0 in | Wt 121.8 lb

## 2017-12-23 DIAGNOSIS — E440 Moderate protein-calorie malnutrition: Secondary | ICD-10-CM | POA: Diagnosis not present

## 2017-12-23 DIAGNOSIS — K219 Gastro-esophageal reflux disease without esophagitis: Secondary | ICD-10-CM

## 2017-12-23 DIAGNOSIS — F39 Unspecified mood [affective] disorder: Secondary | ICD-10-CM | POA: Diagnosis not present

## 2017-12-23 DIAGNOSIS — Z931 Gastrostomy status: Secondary | ICD-10-CM | POA: Diagnosis not present

## 2017-12-23 DIAGNOSIS — J860 Pyothorax with fistula: Secondary | ICD-10-CM

## 2017-12-23 MED ORDER — OMEPRAZOLE 20 MG PO CPDR: 20.0000 mg | DELAYED_RELEASE_CAPSULE | Freq: Every day | ORAL | 1 refills | Status: AC

## 2017-12-23 MED ORDER — MIRTAZAPINE 15 MG PO TABS: 15.0000 mg | ORAL_TABLET | Freq: Every day | ORAL | 0 refills | Status: DC

## 2017-12-23 MED ORDER — MIRTAZAPINE 15 MG PO TBDP: 15.0000 mg | ORAL_TABLET | Freq: Every day | ORAL | 0 refills | Status: DC

## 2017-12-23 NOTE — Progress Notes (Signed)
Date:  12/23/2017   Name:  Kimberly Burch   DOB:  [birth year]   MRN:  [id number]   Chief Complaint: No chief complaint on file.  Depression         This is a new problem.  The onset quality is gradual.   Associated symptoms include fatigue, insomnia, restlessness, appetite change and sad.  Associated symptoms include no headaches and no suicidal ideas.  Past treatments include nothing.  Lung cancer with bronchoesophageal fistula - she recently had a stent placed along with a PEG tube. She is doing really well with feeding herself.  She is able to swallow soft foods and small pills but just does not have much appetite. She is concerned about a reading mentioning enlarged heart but a 3D CT of chest at [facility] showed normal heart size.  Review of Systems  Constitutional: Positive for appetite change and fatigue. Negative for chills and fever.  Respiratory: Positive for cough and shortness of breath. Negative for chest tightness and wheezing.   Cardiovascular: Negative for chest pain, palpitations and leg swelling.  Neurological: Negative for dizziness and headaches.  Psychiatric/Behavioral: Positive for depression and sleep disturbance. Negative for suicidal ideas. The patient is nervous/anxious and has insomnia.     Patient Active Problem List   Diagnosis Date Noted  . PEG (percutaneous endoscopic gastrostomy) status ([place]) 11/28/2017  . On enteral nutrition 11/17/2017  . Protein-calorie malnutrition, moderate ([place]) 11/17/2017  . 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 Atherosclerosis of aorta ([place]) 07/01/2017  . Allergic rhinitis 01/26/2017  . Nocturnal leg cramps 08/16/2016  . History of lung cancer 07/16/2016  . Hypothyroidism 07/16/2016  . Gait instability 07/16/2016  . Vitamin D deficiency 07/16/2016  . Osteoarthritis 07/16/2016    No Known Allergies  Past Surgical History:  Procedure Laterality Date  . ESOPHAGOSCOPY W/ PERCUTANEOUS GASTROSTOMY TUBE PLACEMENT   11/16/2017   bronchoesophageal fistula  . INNER EAR SURGERY     for hearing loss. L ear.  . TONSILECTOMY, ADENOIDECTOMY, BILATERAL MYRINGOTOMY AND TUBES    . TUBAL LIGATION      Social History   Tobacco Use  . Smoking status: Former Smoker    Packs/day: 2.00    Years: 30.00    Pack years: 60.00    Types: Cigarettes    Last attempt to quit: 1995    Years since quitting: 24.9  . Smokeless tobacco: Never Used  . Tobacco comment: smoking cessation materials not required  Substance Use Topics  . Alcohol use: No  . Drug use: No     Medication list has been reviewed and updated.  Current Meds  Medication Sig  . fluticasone-salmeterol (ADVAIR HFA) 115-21 MCG/ACT inhaler Inhale 2 puffs into the lungs 2 (two) times daily.  Marland Kitchen levothyroxine (SYNTHROID, LEVOTHROID) 50 MCG tablet Take 1 tablet (50 mcg total) by mouth daily before breakfast.  . loratadine (ALLERGY RELIEF) 10 MG tablet Take 10 mg by mouth daily.  . Multiple Vitamins-Minerals (TROPICAL LIQUID NUTRITION) LIQD Take by mouth.  Marland Kitchen omeprazole (PRILOSEC) 20 MG capsule Take 20 mg by mouth daily.  . ondansetron (ZOFRAN-ODT) 4 MG disintegrating tablet DIS ONE T PO  Q 8 H PRN N  . Tiotropium Bromide Monohydrate (SPIRIVA RESPIMAT) 1.25 MCG/ACT AERS Inhale 2 puffs into the lungs daily.    PHQ 2/9 Scores 10/10/2017 10/10/2017 08/16/2016  PHQ - 2 Score 6 6 0  PHQ- 9 Score 14 14 -    Physical Exam  Constitutional: She is oriented to person,  place, and time. She appears well-developed and well-nourished. No distress.  HENT:  Head: Normocephalic and atraumatic.  Neck: Normal range of motion. Neck supple.  Cardiovascular: Normal rate, regular rhythm and normal heart sounds.  Pulmonary/Chest: Effort normal. No respiratory distress. She has decreased breath sounds. She has no wheezes. She has no rhonchi.  Abdominal: Soft. Bowel sounds are normal. There is no tenderness.    Musculoskeletal: Normal range of motion.  Lymphadenopathy:     She has no cervical adenopathy.  Neurological: She is alert and oriented to person, place, and time.  Skin: Skin is warm and dry. No rash noted.  Psychiatric: She has a normal mood and affect. Her behavior is normal. Thought content normal.  Nursing note and vitals reviewed.   BP 118/72 (BP Location: Right Arm, Patient Position: Sitting, Cuff Size: Normal)   Pulse (!) 112   Ht 5\' 3"  (1.6 m)   Wt 121 lb 12.8 oz (55.2 kg)   SpO2 99%   BMI 21.58 kg/m   Assessment and Plan: 1. Gastroesophageal reflux disease, esophagitis presence not specified Continue PPI - omeprazole (PRILOSEC) 20 MG capsule; Take 1 capsule (20 mg total) by mouth daily.  Dispense: 90 capsule; Refill: 1  2. Mood disorder ([place]) Begin remeron to help with sleep, mood and appetite - mirtazapine (REMERON SOL-TAB) 15 MG disintegrating tablet; Take 1 tablet (15 mg total) by mouth at bedtime.  Dispense: 30 tablet; Refill: 0  3. Protein-calorie malnutrition, moderate ([place]) Continue tube feedings which is complete nutrition  4. PEG (percutaneous endoscopic gastrostomy) status ([place]) intact  5. 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 Any errors are unintentional.  Halina Maidens, MD [facility]  12/23/2017

## 2018-01-05 ENCOUNTER — Inpatient Hospital Stay: Payer: Medicare Other | Attending: Oncology

## 2018-01-05 DIAGNOSIS — Z85118 Personal history of other malignant neoplasm of bronchus and lung: Secondary | ICD-10-CM | POA: Diagnosis present

## 2018-01-05 DIAGNOSIS — Z85828 Personal history of other malignant neoplasm of skin: Secondary | ICD-10-CM | POA: Insufficient documentation

## 2018-01-05 DIAGNOSIS — Z95828 Presence of other vascular implants and grafts: Secondary | ICD-10-CM

## 2018-01-05 DIAGNOSIS — Z452 Encounter for adjustment and management of vascular access device: Secondary | ICD-10-CM | POA: Diagnosis present

## 2018-01-05 MED ORDER — HEPARIN SOD (PORK) LOCK FLUSH 100 UNIT/ML IV SOLN
500.0000 [IU] | Freq: Once | INTRAVENOUS | Status: AC
  Administered 2018-01-05: 500 [IU] via INTRAVENOUS

## 2018-01-05 MED ORDER — SODIUM CHLORIDE 0.9% FLUSH
10.0000 mL | INTRAVENOUS | Status: DC | PRN
  Administered 2018-01-05: 10 mL via INTRAVENOUS
  Filled 2018-01-05: qty 10

## 2018-01-06 ENCOUNTER — Inpatient Hospital Stay: Payer: Medicare Other

## 2018-01-10 ENCOUNTER — Encounter: Payer: Medicare Other | Admitting: Internal Medicine

## 2018-01-11 ENCOUNTER — Telehealth: Payer: Self-pay

## 2018-01-11 NOTE — Telephone Encounter (Signed)
Patient called and left VM stating mirtazapine medication is working. She tolerates it well. Wants 90 days sent to her mail order [facility].  Please Advise.

## 2018-01-12 ENCOUNTER — Other Ambulatory Visit: Payer: Self-pay | Admitting: Internal Medicine

## 2018-01-12 DIAGNOSIS — F39 Unspecified mood [affective] disorder: Secondary | ICD-10-CM

## 2018-01-12 MED ORDER — MIRTAZAPINE 15 MG PO TABS: 15.0000 mg | ORAL_TABLET | Freq: Every day | ORAL | 1 refills | Status: DC

## 2018-01-13 ENCOUNTER — Ambulatory Visit (INDEPENDENT_AMBULATORY_CARE_PROVIDER_SITE_OTHER): Payer: Medicare Other | Admitting: Internal Medicine

## 2018-01-13 ENCOUNTER — Ambulatory Visit
Admission: RE | Admit: 2018-01-13 | Discharge: 2018-01-13 | Disposition: A | Discharge status: Home / Self Care | Payer: Medicare Other | Source: Ambulatory Visit | Attending: Internal Medicine | Admitting: Internal Medicine

## 2018-01-13 ENCOUNTER — Encounter: Payer: Self-pay | Admitting: Internal Medicine

## 2018-01-13 VITALS — BP 108/62 | HR 109 | Ht 63.0 in | Wt 121.0 lb

## 2018-01-13 DIAGNOSIS — C3491 Malignant neoplasm of unspecified part of right bronchus or lung: Secondary | ICD-10-CM

## 2018-01-13 DIAGNOSIS — J449 Chronic obstructive pulmonary disease, unspecified: Secondary | ICD-10-CM | POA: Diagnosis not present

## 2018-01-13 DIAGNOSIS — R042 Hemoptysis: Secondary | ICD-10-CM | POA: Diagnosis present

## 2018-01-13 MED ORDER — PREDNISONE 20 MG PO TABS: 20.0000 mg | ORAL_TABLET | Freq: Every day | ORAL | 0 refills | Status: DC

## 2018-01-13 NOTE — Patient Instructions (Addendum)
Prednisone 20 mg daily for 7 days for bronchitis   CT chest for lung cancer-hemoptysis   Continue Advair and SPIRIVA as prescribed

## 2018-01-13 NOTE — Progress Notes (Signed)
Name: Kimberly Burch MRN: [id number] DOB: 01/29/1941     CONSULTATION DATE: 09/14/2017 REFERRING MD : Grayland Ormond  CHIEF COMPLAINT: Chronic productive cough  STUDIES:     4.15.19 CT chest Independently reviewed by Me Volume loss in the right lung Post treatment scarring  5 x 4 cm cavitary process posterior right lung base is not substantially changed when measured at the same level. Tiny peripheral nodules in the left lung are stable. No left pleural effusion.  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 She then received 2 cycles of consolidation treatment completing on November 15, 2014. She had a bronchoscopy performed on March 02, 2016 which did not reveal any signs of recurrent or progressive disease.  Her most recent CT scan on May 16, 2017 reviewed independently and reported as above with no definitive new or progressive disease.  No intervention is needed at this time.     HISTORY OF PRESENT ILLNESS: Patient is for follow-up COPD and abnormal chest CT Previous CT scan finding shows complete opacification of the right lung with cavitary lesion and lung volume loss Basically the right lung is not functioning at this time which has been explained to the patient  ONO and 6-minute walk results do not reveal exertional or nocturnal hypoxia No signs of infection at this time  However patient does have some blood-tinged sputum production with hemoptysis submassive over the last 3 days  Patient has a history of lung cancer in the last  Several months ago patient had esophageal fistula and had an esophageal stent placed by [facility]  Patient with chronic shortness of breath Started on Advair and Spiriva at last visit She has a chronic productive cough consistent with chronic bronchitis  Last treatment with antibiotics and steroids was April 2019 for COPD  exacerbation  She has intermittent wheezing      PAST MEDICAL HISTORY :   has a past medical history of Cancer ([place]) (07/2014), Personal history of chemotherapy, Personal history of radiation therapy, Skin cancer, and Thyroid disease.  has a past surgical history that includes Tubal ligation; Inner ear surgery; Tonsilectomy, adenoidectomy, bilateral myringotomy and tubes; and Esophagoscopy w/ percutaneous gastrostomy tube placement (11/16/2017). Prior to Admission medications   Medication Sig Start Date End Date Taking? Authorizing Provider  levothyroxine (SYNTHROID, LEVOTHROID) 50 MCG tablet Take 1 tablet (50 mcg total) by mouth daily before breakfast. 11/15/16  Yes Plonk, Gwyndolyn Saxon, MD  loratadine (ALLERGY RELIEF) 10 MG tablet Take 10 mg by mouth daily.   Yes [provider]  Multiple Vitamins-Minerals (VISION FORMULA EYE HEALTH PO) Take 4 tablets by mouth daily.   Yes [provider]   Allergies  Allergen Reactions  . Gabapentin Other (See Comments)    Extreme irritability    FAMILY HISTORY:  family history includes Breast cancer (age of onset: 66) in her paternal grandmother; Cancer in her maternal aunt and paternal grandmother; Diabetes in her father; Heart disease in her mother; Hyperlipidemia in her father. SOCIAL HISTORY:  reports that she quit smoking about 24 years ago. Her smoking use included cigarettes. She has a 60.00 pack-year smoking history. She has never used smokeless tobacco. She reports that she does not drink alcohol or use drugs.   Review of Systems:  Gen:  Denies  fever, sweats, chills weigh loss  HEENT: Denies blurred vision, double vision, ear pain, eye pain, hearing loss, nose bleeds, sore throat  Cardiac:  No dizziness, chest pain or heaviness, chest tightness,edema, No JVD Resp:   No cough, -sputum production, -shortness of breath,-wheezing, -hemoptysis,  Gi: Denies swallowing difficulty, stomach pain, nausea or vomiting, diarrhea,  constipation, bowel incontinence Gu:  Denies bladder incontinence, burning urine Ext:   Denies Joint pain, stiffness or swelling Skin: Denies  skin rash, easy bruising or bleeding or hives Endoc:  Denies polyuria, polydipsia , polyphagia or weight change Psych:   Denies depression, insomnia or hallucinations  Other:  All other systems negative     BP 108/62 (BP Location: Left Arm, Cuff Size: Normal)   Pulse (!) 109   Ht 5\' 3"  (1.6 m)   Wt 121 lb (54.9 kg)   SpO2 99%   BMI 21.43 kg/m   Physical Examination:   GENERAL:NAD, no fevers, chills, no weakness no fatigue HEAD: Normocephalic, atraumatic.  EYES: Pupils equal, round, reactive to light. Extraocular muscles intact. No scleral icterus.  MOUTH: Moist mucosal membrane. Dentition intact. No abscess noted.  EAR, NOSE, THROAT: Clear without exudates. No external lesions.  NECK: Supple. No thyromegaly. No nodules. No JVD.  PULMONARY: CTA B/L no wheezing, rhonchi, crackles CARDIOVASCULAR: S1 and S2. Regular rate and rhythm. No murmurs, rubs, or gallops. No edema. Pedal pulses 2+ bilaterally.  GASTROINTESTINAL: Soft, nontender, nondistended. No masses. Positive bowel sounds. No hepatosplenomegaly.  MUSCULOSKELETAL: No swelling, clubbing, or edema. Range of motion full in all extremities.  NEUROLOGIC: Cranial nerves II through XII are intact. No gross focal neurological deficits. Sensation intact. Reflexes intact.  SKIN: No ulceration, lesions, rashes, or cyanosis. Skin warm and dry. Turgor intact.  PSYCHIATRIC: Mood, affect within normal limits. The patient is awake, alert and oriented x 3. Insight, judgment intact.  ALL OTHER ROS ARE NEGATIVE         ASSESSMENT / PLAN: 77 year old pleasant white female seen today for follow-up abnormal CT chest with a history of squamous cell carcinoma of the right lung stage IIIb with evidence of progressive volume loss and function of the right lung due to radiation pneumonitis and fibrosis  with a persistent cavitary lung lesion in the right lung in the setting of underlying COPD  At this time she has acute submassive hemoptysis with blood-tinged sputum   Hemoptysis blood-tinged sputum Obtain CT chest stat to assess for interval changes Prednisone 20 mg daily for 10 days for bronchitis   COPD exacerbation Mild with submassive hemoptysis Most likely related to chronic bronchitis  COPD Continue Advair HFA Continue Spiriva Respimat as prescribed Albuterol as needed   Esophageal stent  recommend follow-up at [facility] for further evaluation and assessment

## 2018-01-16 ENCOUNTER — Telehealth: Payer: Self-pay | Admitting: Internal Medicine

## 2018-01-16 DIAGNOSIS — J449 Chronic obstructive pulmonary disease, unspecified: Secondary | ICD-10-CM

## 2018-01-16 MED ORDER — PREDNISONE 20 MG PO TABS: 20.0000 mg | ORAL_TABLET | Freq: Every day | ORAL | 0 refills | Status: DC

## 2018-01-18 NOTE — Telephone Encounter (Signed)
I called and discussed CT results She was advised and needs to follow up CT surgeon who placed esophageal stent

## 2018-02-02 ENCOUNTER — Emergency Department: Payer: Medicare Other

## 2018-02-02 ENCOUNTER — Emergency Department
Admission: EM | Admit: 2018-02-02 | Discharge: 2018-02-03 | Disposition: A | Discharge status: Home / Self Care | Payer: Medicare Other | Attending: Emergency Medicine | Admitting: Emergency Medicine

## 2018-02-02 DIAGNOSIS — J189 Pneumonia, unspecified organism: Secondary | ICD-10-CM | POA: Insufficient documentation

## 2018-02-02 DIAGNOSIS — J181 Lobar pneumonia, unspecified organism: Secondary | ICD-10-CM

## 2018-02-02 DIAGNOSIS — E039 Hypothyroidism, unspecified: Secondary | ICD-10-CM | POA: Diagnosis not present

## 2018-02-02 DIAGNOSIS — Z79899 Other long term (current) drug therapy: Secondary | ICD-10-CM | POA: Diagnosis not present

## 2018-02-02 DIAGNOSIS — Z87891 Personal history of nicotine dependence: Secondary | ICD-10-CM | POA: Diagnosis not present

## 2018-02-02 DIAGNOSIS — R079 Chest pain, unspecified: Secondary | ICD-10-CM | POA: Diagnosis present

## 2018-02-02 LAB — TROPONIN I: Troponin I: <0.03 ng/mL (ref <0.03)

## 2018-02-02 LAB — COMPREHENSIVE METABOLIC PANEL
ALK PHOS: 88 U/L (ref 38–126)
ALT: 31 U/L (ref 0–44)
AST: 35 U/L (ref 15–41)
Albumin: 3.5 g/dL (ref 3.5–5.0)
Anion gap: 7 (ref 5–15)
BILIRUBIN TOTAL: 0.4 mg/dL (ref 0.3–1.2)
BUN: 23 mg/dL (ref 8–23)
CO2: 30 mmol/L (ref 22–32)
Calcium: 8.9 mg/dL (ref 8.9–10.3)
Chloride: 102 mmol/L (ref 98–111)
Creatinine, Ser: 0.64 mg/dL (ref 0.44–1.00)
GFR calc Af Amer: >60 mL/min (ref >60)
GFR calc non Af Amer: >60 mL/min (ref >60)
Glucose, Bld: 124 mg/dL — ABNORMAL HIGH (ref 70–99)
Potassium: 4.2 mmol/L (ref 3.5–5.1)
Sodium: 139 mmol/L (ref 135–145)
TOTAL PROTEIN: 7.2 g/dL (ref 6.5–8.1)

## 2018-02-02 LAB — CBC WITH DIFFERENTIAL/PLATELET
Abs Immature Granulocytes: 0.04 10*3/uL (ref 0.00–0.07)
Basophils Absolute: 0 10*3/uL (ref 0.0–0.1)
Basophils Relative: 0 %
Eosinophils Absolute: 0.1 10*3/uL (ref 0.0–0.5)
Eosinophils Relative: 1 %
HCT: 36.4 % (ref 36.0–46.0)
HEMOGLOBIN: 11.2 g/dL — AB (ref 12.0–15.0)
Immature Granulocytes: 0 %
LYMPHS ABS: 1 10*3/uL (ref 0.7–4.0)
Lymphocytes Relative: 9 %
MCH: 24.9 pg — ABNORMAL LOW (ref 26.0–34.0)
MCHC: 30.8 g/dL (ref 30.0–36.0)
MCV: 81.1 fL (ref 80.0–100.0)
Monocytes Absolute: 0.7 10*3/uL (ref 0.1–1.0)
Monocytes Relative: 6 %
Neutro Abs: 9.5 10*3/uL — ABNORMAL HIGH (ref 1.7–7.7)
Neutrophils Relative %: 84 %
Platelets: 305 10*3/uL (ref 150–400)
RBC: 4.49 MIL/uL (ref 3.87–5.11)
RDW: 15.6 % — ABNORMAL HIGH (ref 11.5–15.5)
WBC: 11.3 10*3/uL — ABNORMAL HIGH (ref 4.0–10.5)
nRBC: 0 % (ref 0.0–0.2)

## 2018-02-02 MED ORDER — HALOPERIDOL LACTATE 5 MG/ML IJ SOLN
2.0000 mg | Freq: Once | INTRAMUSCULAR | Status: DC
  Filled 2018-02-02: qty 1

## 2018-02-02 MED ORDER — IOHEXOL 350 MG/ML SOLN
75.0000 mL | Freq: Once | INTRAVENOUS | Status: AC | PRN
  Administered 2018-02-02: 75 mL via INTRAVENOUS

## 2018-02-02 MED ORDER — SODIUM CHLORIDE 0.9 % IV BOLUS
1000.0000 mL | Freq: Once | INTRAVENOUS | Status: AC
  Administered 2018-02-02: 1000 mL via INTRAVENOUS

## 2018-02-02 MED ORDER — IBUPROFEN 400 MG PO TABS
400.0000 mg | ORAL_TABLET | Freq: Once | ORAL | Status: DC
  Filled 2018-02-02: qty 1

## 2018-02-02 MED ORDER — MORPHINE SULFATE (PF) 2 MG/ML IV SOLN
2.0000 mg | Freq: Once | INTRAVENOUS | Status: DC
  Filled 2018-02-02: qty 1

## 2018-02-02 NOTE — ED Notes (Signed)
ED Provider at bedside. 

## 2018-02-02 NOTE — ED Notes (Signed)
Patient transported to CT 

## 2018-02-02 NOTE — ED Provider Notes (Signed)
[facility] Emergency Department Provider Note  ____________________________________________   First MD Initiated Contact with Patient 02/02/18 2300     (approximate)  I have reviewed the triage vital signs and the nursing notes.   HISTORY  Chief Complaint Chest Pain   HPI Kimberly Burch is a 78 y.o. female who self presents to the emergency department with left chest pain, shortness of breath, and difficulty swallowing.  Her symptoms have been acutely worsened for the past 3 to 4 days although she has had chronic symptoms for many months.  She has a complex past medical history including previous lung cancer  as well as bronchopleural fistula and bronchoesophageal fistula.  She is status post esophageal stenting.  She has a PEG tube in place and receives the majority of her nutrition through the PEG.  She says that she has recently begun trying to eat more through her mouth however has had difficulty.  When she leans over she vomits.  She feels like food is not "going down" as well as it should.  She is concerned primarily about the nausea and vomiting and the difficulty keeping food down.  She also has increasing productive thick yellow cough.  Symptoms have been insidious onset slowly progressive are now moderate severity.  They are worsened by leaning forward and improved when sitting up.  She denies fevers or chills.   Past Medical History:  Diagnosis Date  . Cancer ([place]) 07/2014   lung cancer- couldn't do surgery. Radiation and chemo.   . Personal history of chemotherapy   . Personal history of radiation therapy   . Skin cancer   . Thyroid disease     Patient Active Problem List   Diagnosis Date Noted  . GERD (gastroesophageal reflux disease) 12/23/2017  . PEG (percutaneous endoscopic gastrostomy) status ([place]) 11/28/2017  . On enteral nutrition 11/17/2017  . Protein-calorie malnutrition, moderate ([place]) 11/17/2017  . 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 Atherosclerosis of aorta ([place]) 07/01/2017  . Allergic rhinitis 01/26/2017  . Nocturnal leg cramps 08/16/2016  . History of lung cancer 07/16/2016  . Hypothyroidism 07/16/2016  . Gait instability 07/16/2016  . Vitamin D deficiency 07/16/2016  . Osteoarthritis 07/16/2016    Past Surgical History:  Procedure Laterality Date  . ESOPHAGOSCOPY W/ PERCUTANEOUS GASTROSTOMY TUBE PLACEMENT  11/16/2017   bronchoesophageal fistula  . INNER EAR SURGERY     for hearing loss. L ear.  . TONSILECTOMY, ADENOIDECTOMY, BILATERAL MYRINGOTOMY AND TUBES    . TUBAL LIGATION      Prior to Admission medications   Medication Sig Start Date End Date Taking? Authorizing Provider  fluticasone-salmeterol (ADVAIR HFA) 115-21 MCG/ACT inhaler Inhale 2 puffs into the lungs 2 (two) times daily. 10/05/17   Flora Lipps, MD  levofloxacin (LEVAQUIN) 25 MG/ML solution Take 20 mLs (500 mg total) by mouth daily for 7 days. 02/03/18 02/10/18  Darel Hong, MD  levothyroxine (SYNTHROID, LEVOTHROID) 50 MCG tablet Take 1 tablet (50 mcg total) by mouth daily before breakfast. 10/05/17   Glean Hess, MD  loratadine (ALLERGY RELIEF) 10 MG tablet Take 10 mg by mouth daily.    [provider]  mirtazapine (REMERON) 15 MG tablet Take 1 tablet (15 mg total) by mouth at bedtime. 01/12/18   Glean Hess, MD  omeprazole (PRILOSEC) 20 MG capsule Take 1 capsule (20 mg total) by mouth daily. 12/23/17   Glean Hess, MD  ondansetron (ZOFRAN-ODT) 4 MG disintegrating tablet DIS ONE T PO  Q  8 H PRN N 11/19/17   [provider]  predniSONE (DELTASONE) 20 MG tablet Take 1 tablet (20 mg total) by mouth daily with breakfast. 10 days 01/16/18   Flora Lipps, MD  Tiotropium Bromide Monohydrate (SPIRIVA RESPIMAT) 1.25 MCG/ACT AERS Inhale 2 puffs into the lungs daily. 10/06/17   Flora Lipps, MD    Allergies Gabapentin  Family History  Problem Relation Age of Onset  . Diabetes Father   . Hyperlipidemia  Father   . Cancer Maternal Aunt   . Cancer Paternal Grandmother   . Breast cancer Paternal Grandmother 58  . Heart disease Mother     Social History Social History   Tobacco Use  . Smoking status: Former Smoker    Packs/day: 2.00    Years: 30.00    Pack years: 60.00    Types: Cigarettes    Last attempt to quit: 1995    Years since quitting: 25.0  . Smokeless tobacco: Never Used  . Tobacco comment: smoking cessation materials not required  Substance Use Topics  . Alcohol use: No  . Drug use: No    Review of Systems Constitutional: No fever/chills Eyes: No visual changes. ENT: No sore throat. Cardiovascular: Positive for chest pain. Respiratory: Positive for shortness of breath. Gastrointestinal: No abdominal pain.  Positive for nausea, positive for vomiting.  No diarrhea.  No constipation. Genitourinary: Negative for dysuria. Musculoskeletal: Negative for back pain. Skin: Negative for rash. Neurological: Negative for headaches, focal weakness or numbness.   ____________________________________________   PHYSICAL EXAM:  VITAL SIGNS: ED Triage Vitals  Enc Vitals Group     BP 02/02/18 1848 103/61     Pulse Rate 02/02/18 1848 (!) 122     Resp 02/02/18 1848 18     Temp 02/02/18 1848 98.8 F (37.1 C)     Temp Source 02/02/18 1848 Oral     SpO2 02/02/18 1848 99 %     Weight 02/02/18 1848 118 lb (53.5 kg)     Height 02/02/18 1848 5\' 3"  (1.6 m)     Head Circumference --      Peak Flow --      Pain Score 02/02/18 1911 6     Pain Loc --      Pain Edu? --      Excl. in [place]? --     Constitutional: Alert and oriented x4 chronically ill-appearing and slightly cachectic Eyes: PERRL EOMI. Head: Atraumatic. Nose: No congestion/rhinnorhea. Mouth/Throat: No trismus Neck: No stridor.   Cardiovascular: Tachycardic rate, regular rhythm. Grossly normal heart sounds.  Good peripheral circulation. Respiratory: Increased respiratory effort.  No retractions.  Essentially no  air entry on the right and good air entry on the left.  Mild expiratory wheeze on the left Gastrointestinal: Soft nontender.  PEG in good position Musculoskeletal: No lower extremity edema   Neurologic:  Normal speech and language. No gross focal neurologic deficits are appreciated. Skin:  Skin is warm, dry and intact. No rash noted. Psychiatric: Mood and affect are normal. Speech and behavior are normal.    ____________________________________________   DIFFERENTIAL includes but not limited to  Pneumonia, pulmonary embolism, misplaced esophageal stent, aspiration, dehydration ____________________________________________   LABS (all labs ordered are listed, but only abnormal results are displayed)  Labs Reviewed  CBC WITH DIFFERENTIAL/PLATELET - Abnormal; Notable for the following components:      Result Value   WBC 11.3 (*)    Hemoglobin 11.2 (*)    MCH 24.9 (*)    RDW 15.6 (*)  Neutro Abs 9.5 (*)    All other components within normal limits  COMPREHENSIVE METABOLIC PANEL - Abnormal; Notable for the following components:   Glucose, Bld 124 (*)    All other components within normal limits  TROPONIN I    Lab work reviewed by me with slightly elevated white count which is nonspecific __________________________________________  EKG  ED ECG REPORT I, Darel Hong, the attending physician, personally viewed and interpreted this ECG.  Date: 02/02/2018 EKG Time:  Rate: 123 Rhythm: Sinus tachycardia QRS Axis: normal Intervals: normal ST/T Wave abnormalities: normal Narrative Interpretation: no evidence of acute ischemia  ____________________________________________  RADIOLOGY  CT angiogram of the chest reviewed by me is quite complex but essentially unchanged from previous ____________________________________________   PROCEDURES  Procedure(s) performed: no  Procedures  Critical Care performed:  no  ____________________________________________   INITIAL IMPRESSION / ASSESSMENT AND PLAN / ED COURSE  Pertinent labs & imaging results that were available during my care of the patient were reviewed by me and considered in my medical decision making (see chart for details).   As part of my medical decision making, I reviewed the following data within the [place] History obtained from family if available, nursing notes, old chart and ekg, as well as notes from prior ED visits.  The patient comes to the emergency department with nausea vomiting decreased oral intake as well as productive cough that has been progressive.  Her past medical history is extremely complex and to fully evaluate her respiratory symptoms I do believe she will require CT scan with IV contrast.  We will begin with a liter of IV fluids along with 2 mg of IV morphine and 2 mg of IV haloperidol for pain and nausea.     ----------------------------------------- 1:07 AM on 02/03/2018 -----------------------------------------  The patient CT scan is essentially unchanged from previous.  Her heart rate is come down nicely with fluids.  Had a lengthy discussion with the patient and her family at bedside and we reviewed her images together.  The patient's largest concern is that after she eats and leans over food seems to come up.  We discussed that with an esophageal stent she no longer has an lower esophageal sphincter.  The patient and her son feels significant reassurance and are comfortable going home.  It seems as if the patient was under the impression that she would be able to eat normally in the future however on review of her gastroenterologist chart this is not actually the goal and her oral intake is from the satisfaction of eating and all of her nutrition should really be coming from her PEG tube.  She verbalizes understanding.  Given the possibility of pneumonia we will treat her with Levaquin for  the next week.  She is discharged home in improved condition in no pain. ____________________________________________   FINAL CLINICAL IMPRESSION(S) / ED DIAGNOSES  Final diagnoses:  Community acquired pneumonia of left upper lobe of lung ([place])      NEW MEDICATIONS STARTED DURING THIS VISIT:  Discharge Medication List as of 02/03/2018  1:06 AM    START taking these medications   Details  levofloxacin (LEVAQUIN) 25 MG/ML solution Take 20 mLs (500 mg total) by mouth daily for 7 days., Starting Fri 02/03/2018, Until Fri 02/10/2018, Print         Note:  This document was prepared using Dragon voice recognition software and may include unintentional dictation errors.    Darel Hong, MD 02/06/18 [phone]

## 2018-02-02 NOTE — ED Triage Notes (Signed)
Patient ambulatory to triage with steady gait, without difficulty or distress noted, mask in place; pt reports left sided CWP radiating into back with inspiration x month; st had CXR in November and dx with "fistula" and told would just observe; st has a stent in esophagus; called GI today to get a barium study due to difficulty swallowing that has increased last 3-4 days

## 2018-02-03 MED ORDER — LEVOFLOXACIN 25 MG/ML PO SOLN: 500.0000 mg | Freq: Every day | ORAL | 0 refills | Status: DC

## 2018-02-03 NOTE — ED Notes (Signed)
Patient had episode of productive coughing with thick, yellow sputum.

## 2018-02-03 NOTE — ED Notes (Signed)
ED Provider at bedside. 

## 2018-02-03 NOTE — Discharge Instructions (Signed)
Fortunately today your lab work was reassuring and your CT scan was mostly similar to your previous.  It is concerning for possible pneumonia so I think it is most reasonable to give you a trial of antibiotics and see if that helps.  Please follow-up with your gastroenterologist this coming week as scheduled and return to the emergency department sooner for any concerns.  It was a pleasure to take care of you today, and thank you for coming to our emergency department.  If you have any questions or concerns before leaving please ask the nurse to grab me and I'm more than happy to go through your aftercare instructions again.  If you were prescribed any opioid pain medication today such as Norco, Vicodin, Percocet, morphine, hydrocodone, or oxycodone please make sure you do not drive when you are taking this medication as it can alter your ability to drive safely.  If you have any concerns once you are home that you are not improving or are in fact getting worse before you can make it to your follow-up appointment, please do not hesitate to call 911 and come back for further evaluation.  Darel Hong, MD  Results for orders placed or performed during the hospital encounter of 02/02/18  CBC with Differential  Result Value Ref Range   WBC 11.3 (H) 4.0 - 10.5 K/uL   RBC 4.49 3.87 - 5.11 MIL/uL   Hemoglobin 11.2 (L) 12.0 - 15.0 g/dL   HCT 36.4 36.0 - 46.0 %   MCV 81.1 80.0 - 100.0 fL   MCH 24.9 (L) 26.0 - 34.0 pg   MCHC 30.8 30.0 - 36.0 g/dL   RDW 15.6 (H) 11.5 - 15.5 %   Platelets 305 150 - 400 K/uL   nRBC 0.0 0.0 - 0.2 %   Neutrophils Relative % 84 %   Neutro Abs 9.5 (H) 1.7 - 7.7 K/uL   Lymphocytes Relative 9 %   Lymphs Abs 1.0 0.7 - 4.0 K/uL   Monocytes Relative 6 %   Monocytes Absolute 0.7 0.1 - 1.0 K/uL   Eosinophils Relative 1 %   Eosinophils Absolute 0.1 0.0 - 0.5 K/uL   Basophils Relative 0 %   Basophils Absolute 0.0 0.0 - 0.1 K/uL   Immature Granulocytes 0 %   Abs Immature  Granulocytes 0.04 0.00 - 0.07 K/uL  Comprehensive metabolic panel  Result Value Ref Range   Sodium 139 135 - 145 mmol/L   Potassium 4.2 3.5 - 5.1 mmol/L   Chloride 102 98 - 111 mmol/L   CO2 30 22 - 32 mmol/L   Glucose, Bld 124 (H) 70 - 99 mg/dL   BUN 23 8 - 23 mg/dL   Creatinine, Ser 0.64 0.44 - 1.00 mg/dL   Calcium 8.9 8.9 - 10.3 mg/dL   Total Protein 7.2 6.5 - 8.1 g/dL   Albumin 3.5 3.5 - 5.0 g/dL   AST 35 15 - 41 U/L   ALT 31 0 - 44 U/L   Alkaline Phosphatase 88 38 - 126 U/L   Total Bilirubin 0.4 0.3 - 1.2 mg/dL   GFR calc non Af Amer >60 >60 mL/min   GFR calc Af Amer >60 >60 mL/min   Anion gap 7 5 - 15  Troponin I - ONCE - STAT  Result Value Ref Range   Troponin I <0.03 <0.03 ng/mL   Dg Chest 2 View  Result Date: 02/02/2018 CLINICAL DATA:  Left-sided chest wall pain radiating to the back for the past month. History  of lung cancer. EXAM: CHEST - 2 VIEW COMPARISON:  CT chest dated January 12, 2018. Chest x-ray dated December 14, 2017. FINDINGS: Unchanged left chest wall port catheter and esophageal stent. Stable chronic volume loss and airspace disease in the right hemithorax. The left lung is clear. No pneumothorax. Normal heart size. Unchanged mediastinal shift to the right. No acute osseous abnormality. IMPRESSION: 1. No definite active cardiopulmonary disease. 2. Unchanged chronic volume loss and diffuse airspace disease in the right hemithorax. Electronically Signed   By: Titus Dubin M.D.   On: 02/02/2018 19:51   Ct Chest Wo Contrast  Result Date: 01/13/2018 CLINICAL DATA:  Hemoptysis for 3 days. Chronic shortness of breath. Known bronchoesophageal fistula status post esophageal stenting. EXAM: CT CHEST WITHOUT CONTRAST TECHNIQUE: Multidetector CT imaging of the chest was performed following the standard protocol without IV contrast. COMPARISON:  Chest CT 11/11/2017 and esophagram 11/15/2017 FINDINGS: Cardiovascular: The heart is normal in size and stable. Stable tortuosity  and calcification of the thoracic aorta. The pulmonary arteries are mildly enlarged and may suggest pulmonary hypertension. Mediastinum/Nodes: Stable fluid in the pericardial recesses and scattered mediastinal and hilar lymph nodes without obvious mass. Esophageal stent is noted but there is a persistent bronchoesophageal fistula (series 3, images 74-77). Lungs/Pleura: Persistent surgical and radiation changes involving the right hemithorax with dense scarring changes, airspace consolidation and extensive bronchomegaly. Numerous patchy ground-glass opacities in the left lung possibly reflecting areas of hemorrhage, inflammation or infection. No pleural effusion or worrisome pulmonary lesions. Stable emphysematous changes. Upper Abdomen: No significant upper abdominal findings. Stable calcified gallstones in the gallbladder and suspected splenomegaly. Musculoskeletal: No chest wall mass, breast masses, supraclavicular or axillary adenopathy. Left-sided Port-A-Cath appears stable. No significant bony findings. IMPRESSION: 1. Despite the esophageal stent there is a persistent bronchoesophageal fistula with associated broncho megaly and persistent dense right lower lobe airspace consolidation. 2. Patchy airspace opacities in left lung could reflect inflammations, infection or hemorrhage. 3. Stable emphysematous changes and postoperative and post radiation changes involving the right hemithorax. Aortic Atherosclerosis (ICD10-I70.0) and Emphysema (ICD10-J43.9). Electronically Signed   By: Marijo Sanes M.D.   On: 01/13/2018 12:15   Ct Angio Chest Pe W/cm &/or Wo Cm  Result Date: 02/03/2018 CLINICAL DATA:  Acute onset of left-sided chest pain, radiating to the back. Known bronchoesophageal fistula, status post esophageal stenting. Recent difficulty swallowing. Initial encounter. EXAM: CT ANGIOGRAPHY CHEST WITH CONTRAST TECHNIQUE: Multidetector CT imaging of the chest was performed using the standard protocol during  bolus administration of intravenous contrast. Multiplanar CT image reconstructions and MIPs were obtained to evaluate the vascular anatomy. CONTRAST:  40mL OMNIPAQUE IOHEXOL 350 MG/ML SOLN COMPARISON:  Chest radiograph performed earlier today at 7:24 p.m., and CT of the chest performed 01/13/2018 FINDINGS: Cardiovascular: There is no evidence of pulmonary embolus. Evaluation for pulmonary embolus is markedly suboptimal on the right, due to near complete opacification of the right lung. The heart is borderline enlarged. The thoracic aorta is grossly unremarkable. The great vessels are within normal limits. Mediastinum/Nodes: There is rightward shift of the mediastinum, reflecting right-sided volume loss. Vague edema is seen tracking about the course of the esophagus. Mild focal pericardial fluid is noted at the superior mediastinum. No definite mediastinal lymphadenopathy is seen. The thyroid gland is diminutive and grossly unremarkable. No axillary lymphadenopathy is appreciated. Mild food material is noted within the mid esophageal stent at the level of the fistula. The esophageal stent is grossly unremarkable in appearance. Lungs/Pleura: The patient's bronchoesophageal fistula is similar in  appearance, despite the patient's esophageal stent. There is increased fluid within the previously noted region of bronchomegaly, and persistent dense right lower lobe and right upper lobe airspace opacification. Patchy airspace opacities are again noted at the left upper lobe, concerning for superimposed pneumonia. No definite pneumothorax is seen. Upper Abdomen: The visualized portions of the liver and spleen are unremarkable. The visualized portions of the adrenal glands and kidneys are within normal limits. Musculoskeletal: No acute osseous abnormalities are identified. The visualized musculature is unremarkable in appearance. Review of the MIP images confirms the above findings. IMPRESSION: 1. No evidence of pulmonary  embolus. Evaluation for pulmonary embolus is markedly suboptimal on the right, due to near complete opacification of the right lung. 2. The patient's bronchoesophageal fistula is similar in appearance, despite the patient's esophageal stent. There is increased fluid within the previously noted region of bronchomegaly, and persistent dense right lower lobe and right upper lobe airspace opacification. 3. Patchy airspace opacities again noted at the left upper lobe, concerning for superimposed pneumonia. 4. Rightward shift of the mediastinum, reflecting right-sided volume loss. Vague edema tracking about the course of the esophagus. Esophageal stent is stable in appearance, with mild food material within the mid esophageal stent at the level of the fistula. Electronically Signed   By: Garald Balding M.D.   On: 02/03/2018 00:21

## 2018-02-03 NOTE — ED Notes (Signed)
Reviewed discharge instructions, follow-up care, and prescriptions with patient. Patient verbalized understanding of all information reviewed. Patient stable, with no distress noted at this time.    

## 2018-02-06 ENCOUNTER — Ambulatory Visit: Payer: Medicare Other | Admitting: Internal Medicine

## 2018-02-10 ENCOUNTER — Ambulatory Visit (INDEPENDENT_AMBULATORY_CARE_PROVIDER_SITE_OTHER): Payer: Medicare Other | Admitting: Internal Medicine

## 2018-02-10 ENCOUNTER — Encounter: Payer: Self-pay | Admitting: Internal Medicine

## 2018-02-10 VITALS — BP 96/62 | HR 114 | Ht 63.0 in | Wt 117.6 lb

## 2018-02-10 DIAGNOSIS — Z85118 Personal history of other malignant neoplasm of bronchus and lung: Secondary | ICD-10-CM

## 2018-02-10 DIAGNOSIS — F39 Unspecified mood [affective] disorder: Secondary | ICD-10-CM | POA: Diagnosis not present

## 2018-02-10 DIAGNOSIS — E440 Moderate protein-calorie malnutrition: Secondary | ICD-10-CM | POA: Diagnosis not present

## 2018-02-10 DIAGNOSIS — J860 Pyothorax with fistula: Secondary | ICD-10-CM

## 2018-02-10 DIAGNOSIS — Z931 Gastrostomy status: Secondary | ICD-10-CM

## 2018-02-10 MED ORDER — MIRTAZAPINE 15 MG PO TBDP: 15.0000 mg | ORAL_TABLET | Freq: Every day | ORAL | 5 refills | Status: AC

## 2018-02-10 NOTE — Patient Instructions (Signed)
Try to increase to 5 cans per day.

## 2018-02-10 NOTE — Progress Notes (Signed)
Date:  02/10/2018   Name:  Kimberly Burch   DOB:  06-23-1940   MRN:  [id number]   Chief Complaint: Depression (6 week follow up. )  Depression         The problem has been gradually improving since onset.  Associated symptoms include no fatigue, not irritable, no decreased interest, no headaches, not sad and no suicidal ideas.     The symptoms are aggravated by social issues.  Treatments tried: remeron has done well - would like to try liquid  Esophageal issues - She is very frustrated with ongoing issues.  She was just seen at [facility].  She was told that the stent was correctly placed but suggested that she could have a barium swallow to further evaluate.  She reports yesterday having a coughing spell that brought up some bright red blood.  She has had no further bleeding.  She is unable to eat orally - anything comes back up.  She is using the feeding tube - 4 cans per day.  Review of Systems  Constitutional: Negative for chills, fatigue, fever and unexpected weight change.  Respiratory: Positive for cough and shortness of breath. Negative for chest tightness and wheezing.   Cardiovascular: Negative for chest pain, palpitations and leg swelling.  Gastrointestinal: Positive for nausea and vomiting. Negative for abdominal pain and blood in stool.  Neurological: Negative for dizziness and headaches.  Psychiatric/Behavioral: Positive for depression. Negative for dysphoric mood, sleep disturbance and suicidal ideas. The patient is not nervous/anxious.     Patient Active Problem List   Diagnosis Date Noted  . GERD (gastroesophageal reflux disease) 12/23/2017  . 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 On enteral nutrition 11/17/2017  . Protein-calorie malnutrition, moderate ([place]) 11/17/2017  . 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 Atherosclerosis of aorta ([place]) 07/01/2017  . Allergic rhinitis 01/26/2017  . Nocturnal leg cramps 08/16/2016  . History of lung  cancer 07/16/2016  . Hypothyroidism 07/16/2016  . Gait instability 07/16/2016  . Vitamin D deficiency 07/16/2016  . Osteoarthritis 07/16/2016    Allergies  Allergen Reactions  . Gabapentin Other (See Comments)    Extreme irritability    Past Surgical History:  Procedure Laterality Date  . ESOPHAGOSCOPY W/ PERCUTANEOUS GASTROSTOMY TUBE PLACEMENT  11/16/2017   bronchoesophageal fistula  . INNER EAR SURGERY     for hearing loss. L ear.  . TONSILECTOMY, ADENOIDECTOMY, BILATERAL MYRINGOTOMY AND TUBES    . TUBAL LIGATION      Social History   Tobacco Use  . Smoking status: Former Smoker    Packs/day: 2.00    Years: 30.00    Pack years: 60.00    Types: Cigarettes    Last attempt to quit: 1995    Years since quitting: 25.0  . Smokeless tobacco: Never Used  . Tobacco comment: smoking cessation materials not required  Substance Use Topics  . Alcohol use: No  . Drug use: No     Medication list has been reviewed and updated.  Current Meds  Medication Sig  . fluticasone-salmeterol (ADVAIR HFA) 115-21 MCG/ACT inhaler Inhale 2 puffs into the lungs 2 (two) times daily.  Marland Kitchen levothyroxine (SYNTHROID, LEVOTHROID) 50 MCG tablet Take 1 tablet (50 mcg total) by mouth daily before breakfast.  . loratadine (ALLERGY RELIEF) 10 MG tablet Take 10 mg by mouth daily.  . mirtazapine (REMERON) 15 MG tablet Take 1 tablet (15 mg total) by mouth at bedtime.  Marland Kitchen omeprazole (PRILOSEC) 20 MG capsule Take 1  capsule (20 mg total) by mouth daily.  . ondansetron (ZOFRAN-ODT) 4 MG disintegrating tablet DIS ONE T PO  Q 8 H PRN N  . Tiotropium Bromide Monohydrate (SPIRIVA RESPIMAT) 1.25 MCG/ACT AERS Inhale 2 puffs into the lungs daily.    PHQ 2/9 Scores 02/10/2018 10/10/2017 10/10/2017 08/16/2016  PHQ - 2 Score 1 6 6  0  PHQ- 9 Score 5 14 14  -    Physical Exam Vitals signs and nursing note reviewed.  Constitutional:      General: She is not irritable.She is not in acute distress.    Appearance: She is  well-developed.  HENT:     Head: Normocephalic and atraumatic.  Neck:     Musculoskeletal: Normal range of motion and neck supple.  Cardiovascular:     Rate and Rhythm: Normal rate and regular rhythm.  Pulmonary:     Effort: Pulmonary effort is normal. No accessory muscle usage or respiratory distress.     Breath sounds: Examination of the right-upper field reveals decreased breath sounds. Examination of the right-middle field reveals decreased breath sounds. Examination of the right-lower field reveals decreased breath sounds. Decreased breath sounds present. No rhonchi.  Abdominal:     General: Abdomen is flat. Bowel sounds are normal.     Tenderness: There is no abdominal tenderness.    Musculoskeletal: Normal range of motion.  Skin:    General: Skin is warm and dry.     Findings: No rash.  Neurological:     Mental Status: She is alert and oriented to person, place, and time.  Psychiatric:        Behavior: Behavior normal.        Thought Content: Thought content normal.     BP 96/62 (BP Location: Right Arm, Patient Position: Sitting, Cuff Size: Normal)   Pulse (!) 114   Ht 5\' 3"  (1.6 m)   Wt 117 lb 9.6 oz (53.3 kg)   SpO2 99%   BMI 20.83 kg/m   Assessment and Plan: 1. Mood disorder ([place]) Doing fairly well on current therapy - change to soltab - mirtazapine (REMERON SOLTAB) 15 MG disintegrating tablet; Take 1 tablet (15 mg total) by mouth at bedtime.  Dispense: 30 tablet; Refill: 5  2. Esophagobronchial fistula ([facility]) Will schedule barium swallow as recommended by [facility] specialist Concern for ulceration with recent bleeding Pt to call if bleeding recurs - DG Esophagus; Future  3. 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 Protein-calorie malnutrition, moderate ([place]) As above  5. History of lung cancer Followed by oncology Next CT scan in April   Partially dictated using Editor, commissioning. Any errors are  unintentional.  Halina Maidens, MD [facility]  02/10/2018

## 2018-02-13 ENCOUNTER — Telehealth: Payer: Self-pay

## 2018-02-13 NOTE — Telephone Encounter (Signed)
Scheduled on 16th.

## 2018-02-13 NOTE — Telephone Encounter (Signed)
Patient came in today with husband for his [place] visit. Stopped by my desk saying she was seen Friday with you and was told you are ordering her barium swallow test?  I don't see a order placed and she said she does not want to go through Dr. Maryjane Hurter about this. He upset her at the last visit and makes her feel as though he does not genuinely care.   She is still coughing up blood on and off.

## 2018-02-13 NOTE — Telephone Encounter (Signed)
I have ordered the test and messaged Nikki.

## 2018-02-15 ENCOUNTER — Other Ambulatory Visit: Payer: Self-pay

## 2018-02-16 ENCOUNTER — Other Ambulatory Visit: Payer: Self-pay | Admitting: Internal Medicine

## 2018-02-16 ENCOUNTER — Ambulatory Visit
Admission: RE | Admit: 2018-02-16 | Discharge: 2018-02-16 | Disposition: A | Discharge status: Home / Self Care | Payer: Medicare Other | Source: Ambulatory Visit | Attending: Internal Medicine | Admitting: Internal Medicine

## 2018-02-16 DIAGNOSIS — J860 Pyothorax with fistula: Secondary | ICD-10-CM | POA: Diagnosis present

## 2018-02-16 DIAGNOSIS — Z85118 Personal history of other malignant neoplasm of bronchus and lung: Secondary | ICD-10-CM

## 2018-02-16 MED ORDER — IOPAMIDOL (ISOVUE-370) INJECTION 76%
75.0000 mL | Freq: Once | INTRAVENOUS | Status: AC | PRN
  Administered 2018-02-16: 75 mL

## 2018-02-17 ENCOUNTER — Inpatient Hospital Stay: Payer: Medicare Other | Attending: Oncology

## 2018-02-17 DIAGNOSIS — J449 Chronic obstructive pulmonary disease, unspecified: Secondary | ICD-10-CM | POA: Diagnosis not present

## 2018-02-17 DIAGNOSIS — Z452 Encounter for adjustment and management of vascular access device: Secondary | ICD-10-CM | POA: Diagnosis present

## 2018-02-17 DIAGNOSIS — R042 Hemoptysis: Secondary | ICD-10-CM | POA: Insufficient documentation

## 2018-02-17 DIAGNOSIS — C3491 Malignant neoplasm of unspecified part of right bronchus or lung: Secondary | ICD-10-CM | POA: Diagnosis present

## 2018-02-17 DIAGNOSIS — Z95828 Presence of other vascular implants and grafts: Secondary | ICD-10-CM

## 2018-02-17 MED ORDER — HEPARIN SOD (PORK) LOCK FLUSH 100 UNIT/ML IV SOLN
500.0000 [IU] | Freq: Once | INTRAVENOUS | Status: AC
  Administered 2018-02-17: 500 [IU] via INTRAVENOUS

## 2018-02-17 MED ORDER — HEPARIN SOD (PORK) LOCK FLUSH 100 UNIT/ML IV SOLN
INTRAVENOUS | Status: AC
  Filled 2018-02-17: qty 5

## 2018-02-17 MED ORDER — SODIUM CHLORIDE 0.9% FLUSH
10.0000 mL | Freq: Once | INTRAVENOUS | Status: AC
  Administered 2018-02-17: 10 mL via INTRAVENOUS
  Filled 2018-02-17: qty 10

## 2018-02-20 ENCOUNTER — Encounter: Payer: Self-pay | Admitting: Internal Medicine

## 2018-02-20 ENCOUNTER — Ambulatory Visit (INDEPENDENT_AMBULATORY_CARE_PROVIDER_SITE_OTHER): Payer: Medicare Other | Admitting: Internal Medicine

## 2018-02-20 VITALS — BP 98/60 | HR 90 | Temp 98.8°F | Ht 63.0 in | Wt 117.0 lb

## 2018-02-20 DIAGNOSIS — J860 Pyothorax with fistula: Secondary | ICD-10-CM | POA: Diagnosis not present

## 2018-02-20 DIAGNOSIS — R0781 Pleurodynia: Secondary | ICD-10-CM

## 2018-02-20 MED ORDER — PREDNISOLONE 15 MG/5ML PO SOLN: 30.0000 mg | Freq: Every day | ORAL | 0 refills | Status: AC

## 2018-02-20 MED ORDER — PREDNISONE 5 MG/ML PO CONC: 40.0000 mg | Freq: Every day | ORAL | 0 refills | Status: DC

## 2018-02-20 NOTE — Addendum Note (Signed)
Addended by: Glean Hess on: 02/20/2018 12:05 PM   Modules accepted: Orders

## 2018-02-20 NOTE — Progress Notes (Signed)
Date:  02/20/2018   Name:  Kimberly Burch   DOB:  1940-02-18   MRN:  [id number]   Chief Complaint: Shortness of Breath (SOB with rib pain. Keeping her awake last night. Started the10th.)  Chest Pain   This is a new problem. The current episode started in the past 7 days. The onset quality is gradual. The problem occurs constantly. The pain is present in the lateral region. The pain is mild. The quality of the pain is described as sharp. The pain does not radiate. Associated symptoms include a cough. Pertinent negatives include no dizziness, exertional chest pressure, fever, headaches, shortness of breath or vomiting. The pain is aggravated by breathing and coughing. She has tried nothing for the symptoms.   Esophagobronchial fistula - has a stent placed but barium study last week showed extravasation of the contrast into the right lung.  She has been referred to Dr. Genevive Bi since she lost faith in the MD at [facility].  She is still waiting to hear about an appointment.  Review of Systems  Constitutional: Positive for fatigue. Negative for chills and fever.  Respiratory: Positive for cough. Negative for chest tightness, shortness of breath and wheezing.   Cardiovascular: Positive for chest pain (pain with deep breathing or coughing).  Gastrointestinal: Negative for vomiting.  Neurological: Negative for dizziness and headaches.    Patient Active Problem List   Diagnosis Date Noted  . GERD (gastroesophageal reflux disease) 12/23/2017  . 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 On enteral nutrition 11/17/2017  . Protein-calorie malnutrition, moderate ([place]) 11/17/2017  . 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 Atherosclerosis of aorta ([place]) 07/01/2017  . Allergic rhinitis 01/26/2017  . Nocturnal leg cramps 08/16/2016  . History of lung cancer 07/16/2016  . Hypothyroidism 07/16/2016  . Gait instability 07/16/2016  . Vitamin D deficiency 07/16/2016  .  Osteoarthritis 07/16/2016    Allergies  Allergen Reactions  . Gabapentin Other (See Comments)    Extreme irritability    Past Surgical History:  Procedure Laterality Date  . ESOPHAGOSCOPY W/ PERCUTANEOUS GASTROSTOMY TUBE PLACEMENT  11/16/2017   bronchoesophageal fistula  . INNER EAR SURGERY     for hearing loss. L ear.  . TONSILECTOMY, ADENOIDECTOMY, BILATERAL MYRINGOTOMY AND TUBES    . TUBAL LIGATION      Social History   Tobacco Use  . Smoking status: Former Smoker    Packs/day: 2.00    Years: 30.00    Pack years: 60.00    Types: Cigarettes    Last attempt to quit: 1995    Years since quitting: 25.0  . Smokeless tobacco: Never Used  . Tobacco comment: smoking cessation materials not required  Substance Use Topics  . Alcohol use: No  . Drug use: No     Medication list has been reviewed and updated.  Current Meds  Medication Sig  . fluticasone-salmeterol (ADVAIR HFA) 115-21 MCG/ACT inhaler Inhale 2 puffs into the lungs 2 (two) times daily.  Marland Kitchen levothyroxine (SYNTHROID, LEVOTHROID) 50 MCG tablet Take 1 tablet (50 mcg total) by mouth daily before breakfast.  . loratadine (ALLERGY RELIEF) 10 MG tablet Take 10 mg by mouth daily.  . mirtazapine (REMERON SOLTAB) 15 MG disintegrating tablet Take 1 tablet (15 mg total) by mouth at bedtime.  Marland Kitchen omeprazole (PRILOSEC) 20 MG capsule Take 1 capsule (20 mg total) by mouth daily.  . ondansetron (ZOFRAN-ODT) 4 MG disintegrating tablet DIS ONE T PO  Q 8 H PRN N  . Tiotropium  Bromide Monohydrate (SPIRIVA RESPIMAT) 1.25 MCG/ACT AERS Inhale 2 puffs into the lungs daily.    PHQ 2/9 Scores 02/10/2018 10/10/2017 10/10/2017 08/16/2016  PHQ - 2 Score 1 6 6  0  PHQ- 9 Score 5 14 14  -    Physical Exam Vitals signs and nursing note reviewed.  Constitutional:      General: She is not in acute distress.    Appearance: She is well-developed. She is not toxic-appearing or diaphoretic.  HENT:     Head: Normocephalic and atraumatic.    Cardiovascular:     Rate and Rhythm: Normal rate and regular rhythm.     Heart sounds: Normal heart sounds.  Pulmonary:     Effort: Pulmonary effort is normal. No accessory muscle usage or respiratory distress.     Breath sounds: Examination of the right-upper field reveals decreased breath sounds. Examination of the right-middle field reveals decreased breath sounds. Examination of the right-lower field reveals decreased breath sounds. Decreased breath sounds present. No wheezing, rhonchi or rales.     Comments: No rub Chest:     Chest wall: No mass or tenderness.  Musculoskeletal: Normal range of motion.  Skin:    General: Skin is warm and dry.     Findings: No rash.  Neurological:     Mental Status: She is alert and oriented to person, place, and time.  Psychiatric:        Behavior: Behavior normal.        Thought Content: Thought content normal.     BP 98/60   Pulse 90   Temp 98.8 F (37.1 C) (Oral)   Ht 5\' 3"  (1.6 m)   Wt 117 lb (53.1 kg)   SpO2 99%   BMI 20.73 kg/m   Assessment and Plan: 1. Pleuritic chest pain Will try short pulse prednisone - predniSONE 5 MG/ML concentrated solution; Place 8 mLs (40 mg total) into feeding tube daily for 5 days.  Dispense: 40 mL; Refill: 0  2. 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 Any errors are unintentional.  Halina Maidens, MD [facility]  02/20/2018

## 2018-02-24 ENCOUNTER — Ambulatory Visit (INDEPENDENT_AMBULATORY_CARE_PROVIDER_SITE_OTHER): Payer: Medicare Other | Admitting: Cardiothoracic Surgery

## 2018-02-24 ENCOUNTER — Other Ambulatory Visit: Payer: Self-pay

## 2018-02-24 ENCOUNTER — Encounter: Payer: Self-pay | Admitting: Cardiothoracic Surgery

## 2018-02-24 VITALS — BP 112/70 | HR 106 | Temp 91.9°F | Ht 63.0 in | Wt 117.6 lb

## 2018-02-24 DIAGNOSIS — J860 Pyothorax with fistula: Secondary | ICD-10-CM

## 2018-02-24 NOTE — Progress Notes (Signed)
Patient ID: Kimberly Burch, female   DOB: [birth year], 78 y.o.   MRN: [id number]  Chief Complaint  Patient presents with  . New Patient (Initial Visit)     new pt ref Dr.Berglund Esophagobronchial Fistual CT [facility]    Referred By Dr. Doyle Askew Reason for Referral tracheoesophageal fistula  HPI Location, Quality, Duration, Severity, Timing, Context, Modifying Factors, Associated Signs and Symptoms.  Kimberly Burch is a 78 y.o. female.  Her problems began approximately 4 to 5 years ago when she underwent radiation and chemotherapy for a right-sided lung cancer at [place].  She did reasonably well after that and ultimately moved to the [place] area to be closer to her daughter.  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  She underwent esophageal stenting at [facility] and also had a PEG tube placed at that time.  She has been receiving her nutrition by way of her gastrostomy.  She states that she is unable to swallow any food and has noticed some blood in her regurgitated saliva.  She states that she is unable to take anything by mouth.  She recently had a repeat Gastrografin swallow which did reveal evidence of a tracheoesophageal fistula.  She has not had an endoscopy since her stent was placed.  She is desirous of being able to swallow again.   Past Medical History:  Diagnosis Date  . Cancer ([place]) 07/2014   lung cancer- couldn't do surgery. Radiation and chemo.   . Personal history of chemotherapy   . Personal history of radiation therapy   . Skin cancer   . Thyroid disease     Past Surgical History:  Procedure Laterality Date  . ESOPHAGOSCOPY W/ PERCUTANEOUS GASTROSTOMY TUBE PLACEMENT  11/16/2017   bronchoesophageal fistula  . INNER EAR SURGERY     for hearing loss. L ear.  . TONSILECTOMY, ADENOIDECTOMY, BILATERAL MYRINGOTOMY AND TUBES    . TUBAL  LIGATION      Family History  Problem Relation Age of Onset  . Diabetes Father   . Hyperlipidemia Father   . Cancer Maternal Aunt   . Cancer Paternal Grandmother   . Breast cancer Paternal Grandmother 28  . Heart disease Mother     Social History Social History   Tobacco Use  . Smoking status: Former Smoker    Packs/day: 2.00    Years: 30.00    Pack years: 60.00    Types: Cigarettes    Last attempt to quit: 1995    Years since quitting: 25.0  . Smokeless tobacco: Never Used  . Tobacco comment: smoking cessation materials not required  Substance Use Topics  . Alcohol use: No  . Drug use: No    Allergies  Allergen Reactions  . Gabapentin Other (See Comments)    Extreme irritability    Current Outpatient Medications  Medication Sig Dispense Refill  . fluticasone-salmeterol (ADVAIR HFA) 115-21 MCG/ACT inhaler Inhale 2 puffs into the lungs 2 (two) times daily. 36 g 2  . levothyroxine (SYNTHROID, LEVOTHROID) 50 MCG tablet Take 1 tablet (50 mcg total) by mouth daily before breakfast. 90 tablet 2  . mirtazapine (REMERON SOLTAB) 15 MG disintegrating tablet Take 1 tablet (15 mg total) by mouth at bedtime. 30 tablet 5  . prednisoLONE (PRELONE) 15 MG/5ML SOLN Take 10 mLs (30 mg total) by mouth daily before breakfast for 5 days. 60 mL 0  . Tiotropium  Bromide Monohydrate (SPIRIVA RESPIMAT) 1.25 MCG/ACT AERS Inhale 2 puffs into the lungs daily. 12 g 3  . loratadine (ALLERGY RELIEF) 10 MG tablet Take 10 mg by mouth daily.    Marland Kitchen omeprazole (PRILOSEC) 20 MG capsule Take 1 capsule (20 mg total) by mouth daily. (Patient not taking: Reported on 02/24/2018) 90 capsule 1  . ondansetron (ZOFRAN-ODT) 4 MG disintegrating tablet DIS ONE T PO  Q 8 H PRN N  3   No current facility-administered medications for this visit.       Review of Systems A complete review of systems was asked and was negative except for the following positive findings fatigue, bronchitis, shortness of breath, pneumonia,  thyroid disease, skin cancer, reflux, ulcer, hearing loss, glasses,  Blood pressure 112/70, pulse (!) 106, temperature (!) 91.9 F (33.3 C), temperature source Temporal, height 5\' 3"  (1.6 m), weight 117 lb 9.6 oz (53.3 kg), SpO2 96 %.  Physical Exam CONSTITUTIONAL:  Pleasant, well-developed, well-nourished, and in no acute distress. EYES: Pupils equal and reactive to light, Sclera non-icteric EARS, NOSE, MOUTH AND THROAT:  The oropharynx was clear.  Dentition is good repair.  Oral mucosa pink and moist. LYMPH NODES:  Lymph nodes in the neck and axillae were normal RESPIRATORY:  Lungs were clear on the left and almost absent on the right.  Normal respiratory effort without pathologic use of accessory muscles of respiration CARDIOVASCULAR: Heart was regular without murmurs.  There were no carotid bruits. GI: The abdomen was soft, nontender, and nondistended. There were no palpable masses. There was no hepatosplenomegaly. There were normal bowel sounds in all quadrants.  There is a percutaneous gastrostomy in the left upper quadrant GU:  Rectal deferred.   MUSCULOSKELETAL:  Normal muscle strength and tone.  No clubbing or cyanosis.   SKIN:  There were no pathologic skin lesions.  There were no nodules on palpation. NEUROLOGIC:  Sensation is normal.  Cranial nerves are grossly intact. PSYCH:  Oriented to person, place and time.  Mood and affect are normal.  Data Reviewed Multiple CT scans and barium swallows  I have personally reviewed the patient's imaging, laboratory findings and medical records.    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  In addition it does appear that there are some concretions within the esophageal stent.  I explained to her that this very difficult situation is hard to manage  and I reviewed with her the option of a cervical esophagostomy or a another attempt at stent placement.  She would like to discuss the option of stent placement again.  She had her procedures performed at [facility] but would prefer to go to [facility] because her daughter works there.  After an extensive discussion with her and her sister they have agreed to make arrangements to go to [facility] for further evaluation of her TE fistula management.       Nestor Lewandowsky, MD 02/24/2018, 8:53 AM

## 2018-02-24 NOTE — Patient Instructions (Addendum)
Patient will need a referral to [place] for TE fistula.   Call the office with any questions or concerns.

## 2018-03-03 ENCOUNTER — Ambulatory Visit: Payer: Self-pay | Admitting: Cardiothoracic Surgery

## 2018-03-06 ENCOUNTER — Telehealth: Payer: Self-pay

## 2018-03-06 NOTE — Telephone Encounter (Signed)
Spoke to [place] who asked that we provide diagnosis for cause of death to [facility] when we get fax. I asked if she knew what situation was when she died. Family found her face down on the bed after she had went to [place] for day. She appeared to have been lying on her stomach face down and had vomited. ME was aware of the Fistula and admitted to wondering if that caused an aspiration but did inform me that [facility] denied autopsy stating there are multiple health concerns that could have contributed to her demise and there is no reason to suspect Foul Play. Aspiration could be cause given vomit and her position on bed but no clear cause of death other than ME said natural causes until you give them your diagnosis.

## 2018-03-31 ENCOUNTER — Inpatient Hospital Stay: Payer: Medicare Other

## 2018-04-02 DEATH — deceased

## 2018-05-22 ENCOUNTER — Ambulatory Visit: Payer: Medicare Other

## 2018-05-26 ENCOUNTER — Ambulatory Visit: Payer: Medicare Other | Admitting: Oncology

## 2018-10-03 DEATH — deceased

## 2018-10-11 ENCOUNTER — Ambulatory Visit: Payer: Medicare Other

## 2019-12-07 IMAGING — CT CT CHEST W/ CM
1 series · 15 of 34 positions shown, 19 images · IV contrast (omnipaque)
Comparison: 05/16/2017

CLINICAL DATA: Squamous cell carcinoma.

EXAM:
CT CHEST WITH CONTRAST
TECHNIQUE: Multidetector CT imaging of the chest was performed during
intravenous contrast administration.
CONTRAST:  75mL OMNIPAQUE IOHEXOL 300 MG/ML  SOLN

[Series 2: axial st · axial · 0.56mm/px · z∈[-242,-18]mm · 15 of 132 slices shown, 19 images]
[im 10/132  mediastinal]
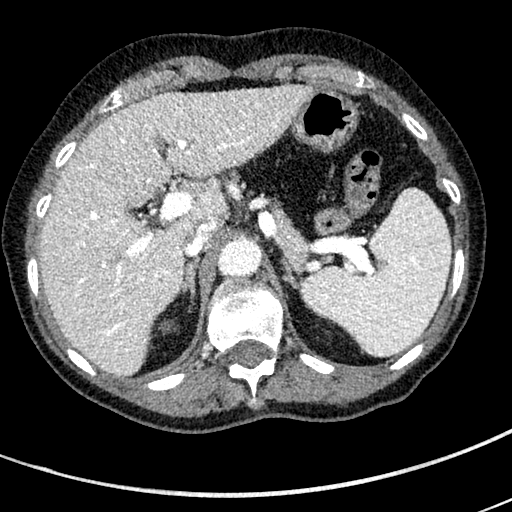
[im 10/132  lung]
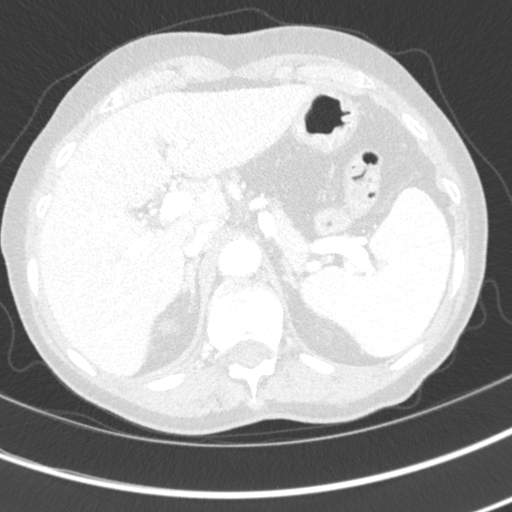
[im 20/132  lung]
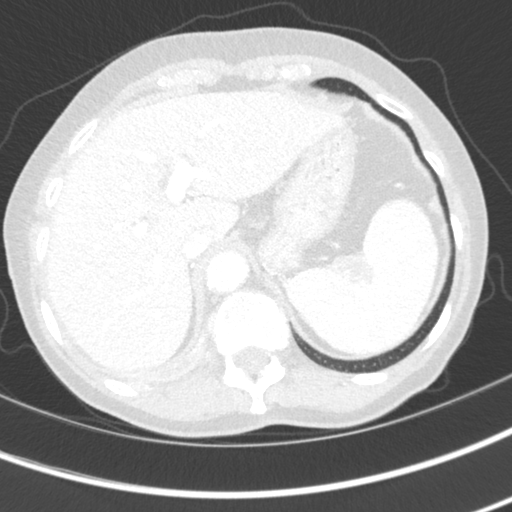
[im 27/132  lung]
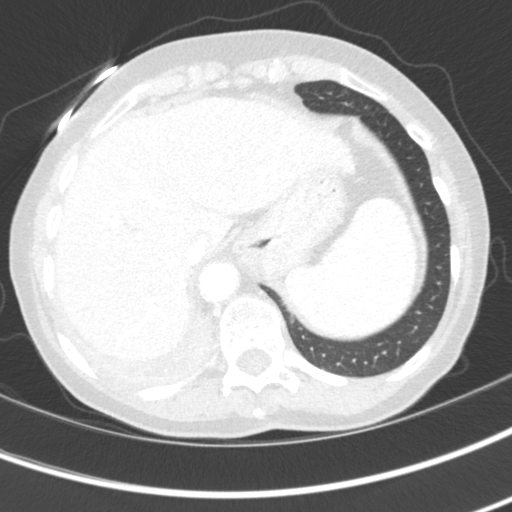
[im 34/132  lung]
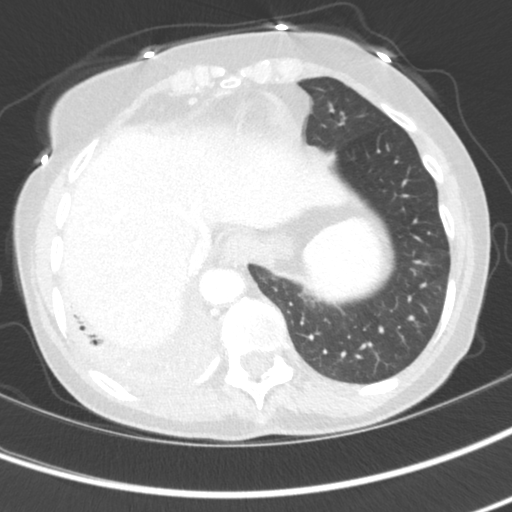
[im 44/132  mediastinal]
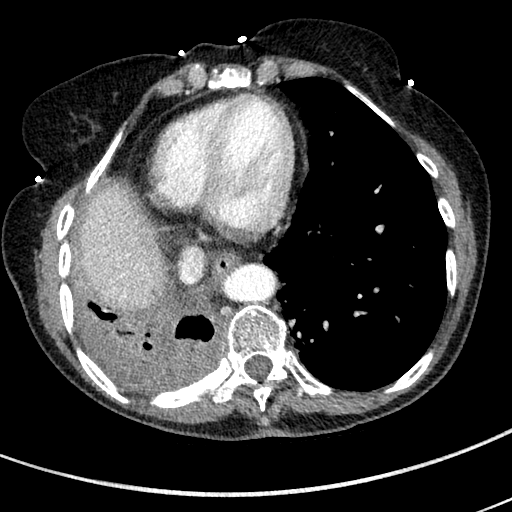
[im 44/132  lung]
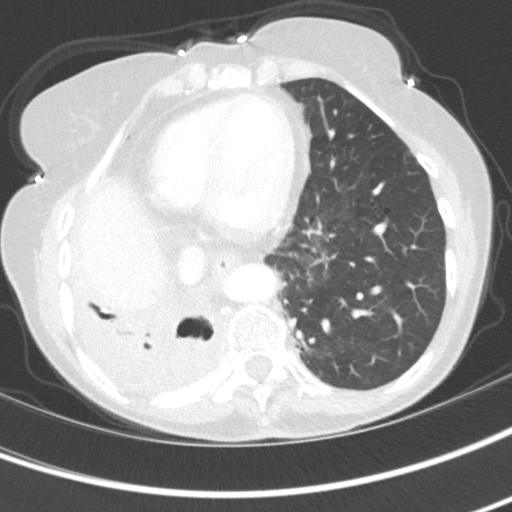
[im 53/132  lung]
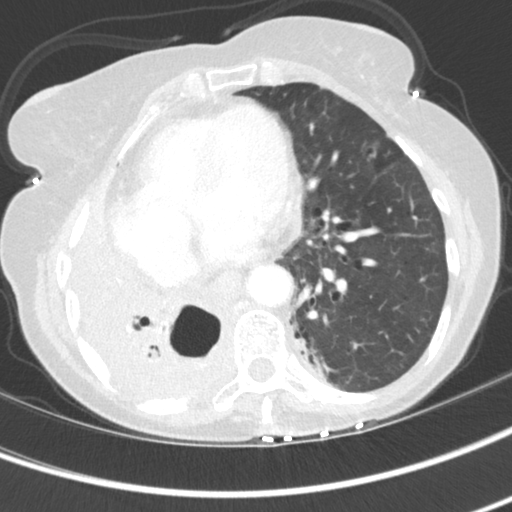
[im 59/132  lung]
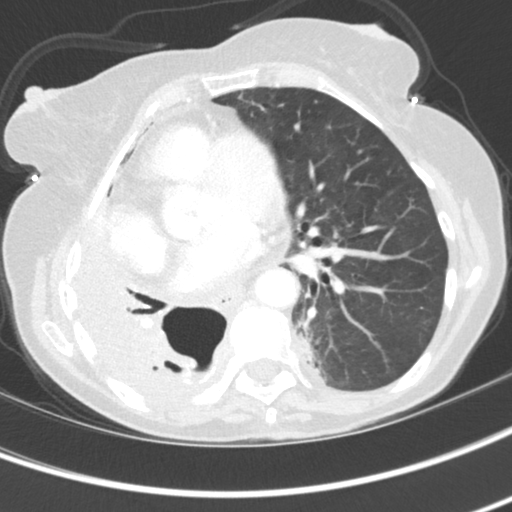
[im 68/132  lung]
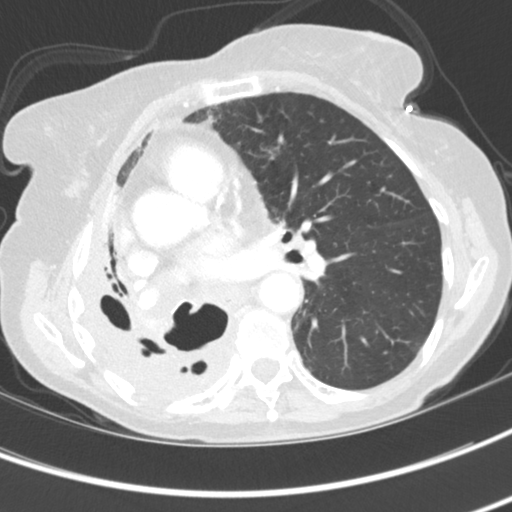
[im 73/132  mediastinal]
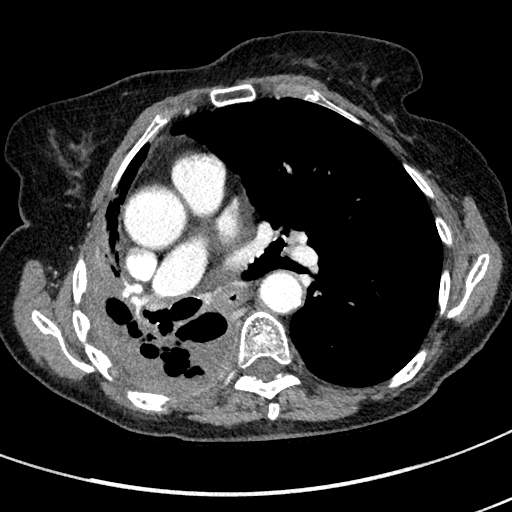
[im 73/132  lung]
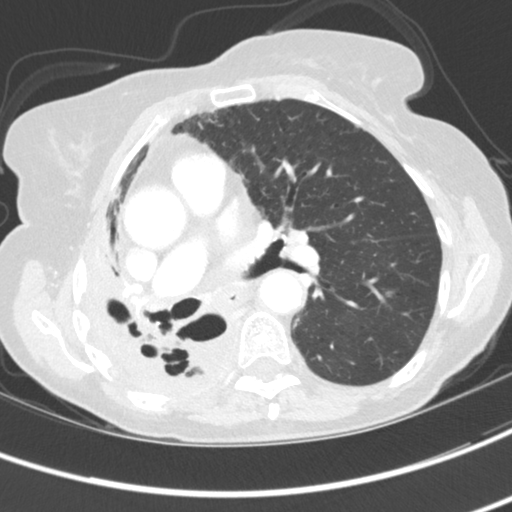
[im 79/132  lung]
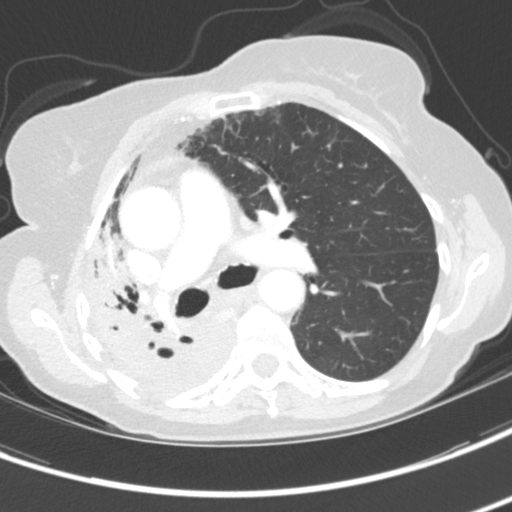
[im 88/132  lung]
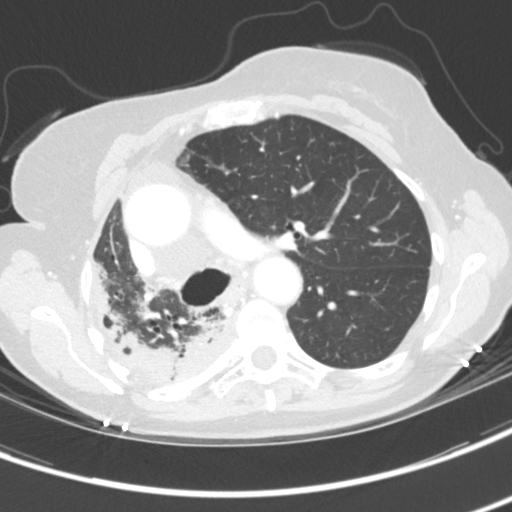
[im 98/132  lung]
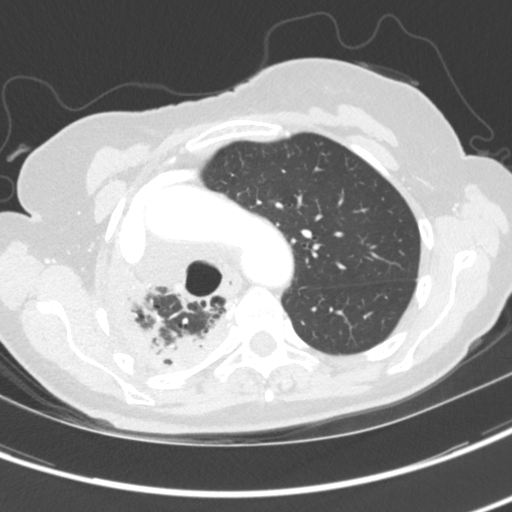
[im 105/132  mediastinal]
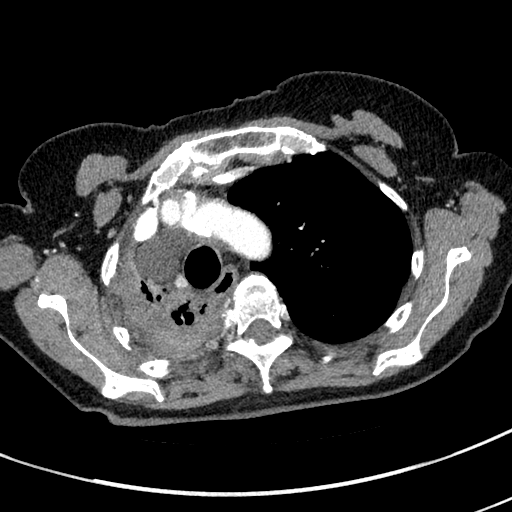
[im 105/132  lung]
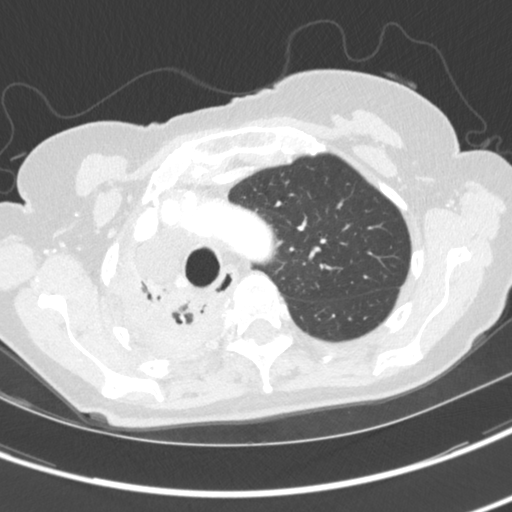
[im 112/132  lung]
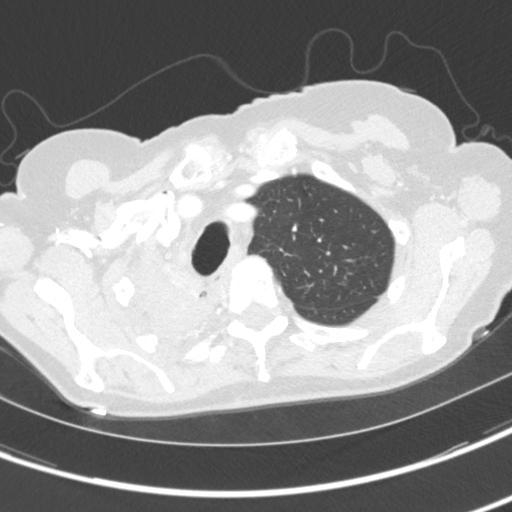
[im 122/132  lung]
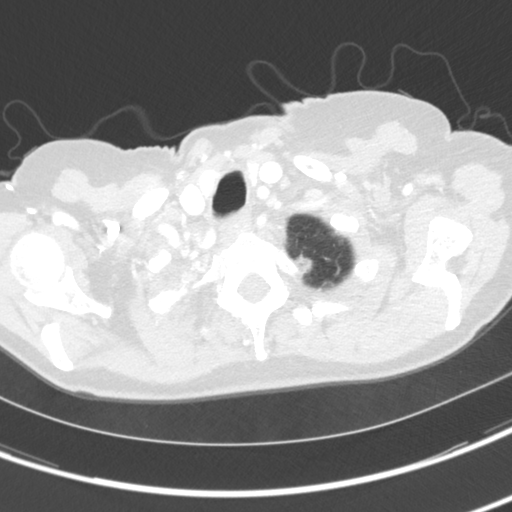

[15 of 34 positions shown; findings below may reference images not displayed]

FINDINGS: Cardiovascular: Heart is enlarged. Coronary artery calcification is
evident. Atherosclerotic calcification is noted in the wall of the
thoracic aorta. Left Port-A-Cath tip is in the proximal SVC.

Mediastinum/Nodes: Similar appearance of scattered small mediastinal
lymph nodes. The 7 mm short axis AP window lymph node measured
previously is stable at 7 mm. Clustered know if nodes in the high
right paratracheal space are slightly more prominent than before
with 1 of the more dominant nodes measuring 7 mm short axis ([DATE]).
There is no axillary lymphadenopathy. The esophagus has normal
imaging features.

Lungs/Pleura: Volume loss in the right hemithorax is stable. The
central tracheobronchial airways are patent. Similar appearance of
the bronchiectasis and consolidative change in the right mid and
lower lung including the dominant cavitary process in the posterior
right costophrenic sulcus. No new suspicious pulmonary nodule or
mass in the left lung. No left pleural effusion.

Upper Abdomen: Gallstones again noted. 8 mm low-density posterior
right liver lesion is similar to prior. Interval development of
cystic change in the splenic hilum measuring 1.5 x 2.2 cm is
indeterminate.

Musculoskeletal: No worrisome lytic or sclerotic osseous
abnormality.
IMPRESSION: 1. Similar volume loss loss right hemithorax with stable appearance
of presumed scarring/consolidation in the posterior right lower
chest.
2. Mild progression of nonenlarged lymph nodes in the high right
paratracheal region. Attention on follow-up.
3. Interval development of a 2.2 cm low-density lesion in the
splenic hilum, indeterminate. Close attention on follow-up
recommended.
4. No change indeterminate 8 mm posterior right liver lesion.
5. Cholelithiasis.
